# Patient Record
Sex: Female | Born: 1980 | Race: White | Hispanic: No | Marital: Married | Smoking: Never smoker
Health system: Southern US, Community
[De-identification: ages and names within clinical notes are randomized; demographics above are authoritative.]

## PROBLEM LIST (undated history)

## (undated) DIAGNOSIS — M5410 Radiculopathy, site unspecified: Secondary | ICD-10-CM

## (undated) DIAGNOSIS — T7840XA Allergy, unspecified, initial encounter: Secondary | ICD-10-CM

## (undated) HISTORY — DX: Radiculopathy, site unspecified: M54.10

## (undated) HISTORY — DX: Allergy, unspecified, initial encounter: T78.40XA

---

## 2002-07-25 ENCOUNTER — Other Ambulatory Visit: Admission: RE | Admit: 2002-07-25 | Discharge: 2002-07-25 | Payer: Self-pay | Admitting: Obstetrics and Gynecology

## 2003-10-09 ENCOUNTER — Other Ambulatory Visit: Admission: RE | Admit: 2003-10-09 | Discharge: 2003-10-09 | Payer: Self-pay | Admitting: Obstetrics and Gynecology

## 2004-11-24 ENCOUNTER — Emergency Department (HOSPITAL_COMMUNITY): Admission: EM | Admit: 2004-11-24 | Discharge: 2004-11-24 | Payer: Self-pay | Admitting: Emergency Medicine

## 2004-11-25 ENCOUNTER — Ambulatory Visit: Payer: Self-pay | Admitting: Family Medicine

## 2005-01-23 ENCOUNTER — Other Ambulatory Visit: Admission: RE | Admit: 2005-01-23 | Discharge: 2005-01-23 | Payer: Self-pay | Admitting: Obstetrics and Gynecology

## 2005-03-02 ENCOUNTER — Ambulatory Visit: Payer: Self-pay | Admitting: Family Medicine

## 2005-03-05 ENCOUNTER — Encounter: Admission: RE | Admit: 2005-03-05 | Discharge: 2005-06-03 | Payer: Self-pay | Admitting: Family Medicine

## 2005-08-10 ENCOUNTER — Ambulatory Visit: Payer: Self-pay | Admitting: Family Medicine

## 2005-12-04 ENCOUNTER — Ambulatory Visit: Payer: Self-pay | Admitting: Family Medicine

## 2006-03-02 ENCOUNTER — Ambulatory Visit: Payer: Self-pay | Admitting: Family Medicine

## 2006-03-15 ENCOUNTER — Other Ambulatory Visit: Admission: RE | Admit: 2006-03-15 | Discharge: 2006-03-15 | Payer: Self-pay | Admitting: Obstetrics and Gynecology

## 2006-05-10 ENCOUNTER — Ambulatory Visit: Payer: Self-pay | Admitting: Family Medicine

## 2006-05-14 ENCOUNTER — Ambulatory Visit: Payer: Self-pay

## 2006-07-16 ENCOUNTER — Ambulatory Visit: Payer: Self-pay | Admitting: Family Medicine

## 2006-11-30 ENCOUNTER — Ambulatory Visit: Payer: Self-pay | Admitting: Family Medicine

## 2007-04-19 ENCOUNTER — Ambulatory Visit: Payer: Self-pay | Admitting: Family Medicine

## 2007-07-19 ENCOUNTER — Ambulatory Visit: Payer: Self-pay | Admitting: Family Medicine

## 2007-07-19 DIAGNOSIS — J45909 Unspecified asthma, uncomplicated: Secondary | ICD-10-CM | POA: Insufficient documentation

## 2007-07-19 DIAGNOSIS — Z9189 Other specified personal risk factors, not elsewhere classified: Secondary | ICD-10-CM

## 2007-07-19 DIAGNOSIS — R51 Headache: Secondary | ICD-10-CM | POA: Insufficient documentation

## 2007-07-19 DIAGNOSIS — R519 Headache, unspecified: Secondary | ICD-10-CM | POA: Insufficient documentation

## 2007-07-19 DIAGNOSIS — J019 Acute sinusitis, unspecified: Secondary | ICD-10-CM

## 2007-07-19 DIAGNOSIS — J309 Allergic rhinitis, unspecified: Secondary | ICD-10-CM | POA: Insufficient documentation

## 2007-11-14 ENCOUNTER — Ambulatory Visit: Payer: Self-pay | Admitting: Family Medicine

## 2007-11-14 DIAGNOSIS — J069 Acute upper respiratory infection, unspecified: Secondary | ICD-10-CM

## 2008-01-30 ENCOUNTER — Telehealth: Payer: Self-pay | Admitting: Family Medicine

## 2008-02-01 ENCOUNTER — Telehealth: Payer: Self-pay | Admitting: Family Medicine

## 2008-02-07 ENCOUNTER — Ambulatory Visit: Payer: Self-pay | Admitting: Family Medicine

## 2008-02-09 ENCOUNTER — Ambulatory Visit: Payer: Self-pay | Admitting: Family Medicine

## 2008-06-26 ENCOUNTER — Inpatient Hospital Stay (HOSPITAL_COMMUNITY): Admission: RE | Admit: 2008-06-26 | Discharge: 2008-06-28 | Payer: Self-pay | Admitting: Obstetrics and Gynecology

## 2008-12-04 ENCOUNTER — Telehealth: Payer: Self-pay | Admitting: Family Medicine

## 2010-11-06 ENCOUNTER — Ambulatory Visit: Payer: Self-pay | Admitting: Family Medicine

## 2011-01-20 NOTE — Assessment & Plan Note (Signed)
Summary: sore throat//ccm   Vital Signs:  Patient profile:   30 year old female Weight:      159 pounds O2 Sat:      97 % on Room air Temp:     99.3 degrees F oral Pulse rate:   87 / minute Pulse rhythm:   regular Resp:     12 per minute BP sitting:   118 / 70  (left arm) Cuff size:   regular  Vitals Entered By: Gladis Riffle, RN (November 06, 2010 10:26 AM)  O2 Flow:  Room air  History of Present Illness:       This is a 30 year old woman who presents with URI symptoms for the last 4-5d.  The patient complains of nasal congestion, sore throat, and dry cough.  Associated symptoms include wheezing and chest tightness for the last 24h.  The patient denies fever.   She has hx of astma, has been doing fine lately on no controller meds.  Use of ventolin helped minimally last night--she noted it is expired.  Preventive Screening-Counseling & Management  Alcohol-Tobacco     Smoking Status: never  Current Problems (verified): 1)  Viral Uri  (ICD-465.9) 2)  Sinusitis, Acute Nos  (ICD-461.9) 3)  Chickenpox, Hx of  (ICD-V15.9) 4)  Headache  (ICD-784.0) 5)  Asthma  (ICD-493.90) 6)  Allergic Rhinitis  (ICD-477.9)  Current Medications (verified): 1)  Zyrtec Allergy 10 Mg  Tabs (Cetirizine Hcl) .... As Needed 2)  Ventolin Hfa 108 (90 Base) Mcg/act Aers (Albuterol Sulfate) .... 2 Puffs Q 4 Hours As Needed Asthma 3)  Prednisone 20 Mg Tabs (Prednisone) .... 2 Tabs By Mouth Once Daily X 5d  Allergies: 1)  ! Codeine 2)  ! Erythromycin  Past History:  Past medical history reviewed for relevance to current acute and chronic problems.  Past Medical History: Reviewed history from 07/19/2007 and no changes required. Allergic rhinitis Asthma Headache  Social History: Smoking Status:  never  Review of Systems       Flu Vaccine Consent Questions     Do you have a history of severe allergic reactions to this vaccine? no    Any prior history of allergic reactions to egg and/or gelatin?  no    Do you have a sensitivity to the preservative Thimersol? no    Do you have a past history of Guillan-Barre Syndrome? no    Do you currently have an acute febrile illness? no    Have you ever had a severe reaction to latex? no    Vaccine information given and explained to patient? yes    Are you currently pregnant? no    Lot Number:[id number]   Exp Date:06/20/2011   Site Given  Left Deltoid IM  Gladis Riffle, RN  November 06, 2010 11:21 AM   Physical Exam  General:  Gen: alert, NAD, NONTOXIC. HEENT: eyes without injection, drainage, or swelling.  Ears: EACs clear, TMs with normal light reflex and landmarks.  Nose: some dried, crusty exudate adherent to some mildly injected mucosa.  No purulent d/c.  No paranasal sinus TTP.  No facial swelling.  Throat and mouth without focal lesion.  No pharyngial swelling, erythema, or exudate.   Neck: supple, no LAD.  LUNGS: CTA bilat, nonlabored resps.  Aeration on expiration is poor, mildly prolonged.  CV: RRR, no m/r/g.     Impression & Recommendations:  Problem # 1:  VIRAL URI (ICD-465.9) Continue symptomatic care with mucinex dm and zyrtec.  Problem #  2:  ASTHMA (ICD-493.90) Mild acute exacerbation.  She felt improved after 2.5mg  albuterol neb here in office today. Flu vaccine IM given today.  Her updated medication list for this problem includes:    Ventolin Hfa 108 (90 Base) Mcg/act Aers (Albuterol sulfate) .Marland Kitchen... 2 puffs q 4 hours as needed asthma    Prednisone 20 Mg Tabs (Prednisone) .Marland Kitchen... 2 tabs by mouth once daily x 5d  Orders: Nebulizer Tx (16109)  Complete Medication List: 1)  Zyrtec Allergy 10 Mg Tabs (Cetirizine hcl) .... As needed 2)  Ventolin Hfa 108 (90 Base) Mcg/act Aers (Albuterol sulfate) .... 2 puffs q 4 hours as needed asthma 3)  Prednisone 20 Mg Tabs (Prednisone) .... 2 tabs by mouth once daily x 5d  Other Orders: Admin 1st Vaccine (60454) Flu Vaccine 31yrs + (09811)  Patient Instructions: 1)  Please schedule a  follow-up appointment as needed .  Prescriptions: PREDNISONE 20 MG TABS (PREDNISONE) 2 tabs by mouth once daily x 5d  #10 x 0   Entered and Authorized by:   Michell Heinrich M.D.   Signed by:   Michell Heinrich M.D. on 11/06/2010   Method used:   Electronically to        [facility]  [address] [phone]* (retail)       [address] [place], [place]  [address]       Ph: [phone] or [phone]       Fax: [phone]   RxID:   [id number] VENTOLIN HFA 108 (90 BASE) MCG/ACT AERS (ALBUTEROL SULFATE) 2 puffs q 4 hours as needed asthma  #1 x 1   Entered and Authorized by:   Michell Heinrich M.D.   Signed by:   Michell Heinrich M.D. on 11/06/2010   Method used:   [facility]ronically to        [facility]  [address] [phone][facility] (retail)       [address] [place], [place]  [address]       Ph: [phone] or [phone]       Fax: [phone]   RxID:   [id number]    Orders Added: 1)  Est. Patient Level IV [76160] 2)  Nebulizer Tx [94640] 3)  Admin 1st Vaccine [90471] 4)  Flu Vaccine 31yrs + [73710]  Appended Document: sore throat//ccm   Medication Administration  Medication # 1:    Medication: Albuterol Sulfate Sol 1mg  unit dose    Diagnosis: VIRAL URI (ICD-465.9)    Dose: 3ml    Route: inhaled    Exp Date: 09/21/2011    Lot #: [id number]    Mfr: nephron    Patient tolerated medication without complications    Given by: Gladis Riffle, RN (November 06, 2010 4:36 PM)  Orders Added: 1)  Albuterol Sulfate Sol 1mg  unit dose [N4627]

## 2011-02-23 ENCOUNTER — Encounter: Payer: Self-pay | Admitting: Internal Medicine

## 2011-02-23 ENCOUNTER — Ambulatory Visit (INDEPENDENT_AMBULATORY_CARE_PROVIDER_SITE_OTHER): Payer: 59 | Admitting: Internal Medicine

## 2011-02-23 DIAGNOSIS — J329 Chronic sinusitis, unspecified: Secondary | ICD-10-CM

## 2011-02-23 MED ORDER — AMOXICILLIN 875 MG PO TABS: 875.0000 mg | ORAL_TABLET | Freq: Two times a day (BID) | ORAL | Status: AC

## 2011-03-02 ENCOUNTER — Encounter: Payer: Self-pay | Admitting: Internal Medicine

## 2011-03-02 NOTE — Progress Notes (Signed)
  Subjective:    Patient ID: Erika [name], female    DOB: 1981/07/31, 30 y.o.   MRN: [id number]  HPI Pt presents to clinic for evaluation of nasal congestion. Notes 1.5 wk h/o intitial mild cough now resolved, nasal congestion and drainage as well as sinus pressure. +sick exposure with child. Now with 4d h/o bilateral ear pain and pressure. Attempting otc antihistamine, tylenol and mucinex without improvement. No alleviating or exacerbating factors.  Reviewed pmh, medications and allergies    Review of Systems  Constitutional: Negative for fever, chills and fatigue.  HENT: Positive for ear pain, congestion and rhinorrhea. Negative for facial swelling, neck pain and ear discharge.   Respiratory: Positive for cough. Negative for shortness of breath and wheezing.        Objective:   Physical Exam  Nursing note and vitals reviewed. Constitutional: She appears well-developed and well-nourished. No distress.  HENT:  Head: Normocephalic and atraumatic.  Right Ear: Tympanic membrane, external ear and ear canal normal.  Left Ear: Tympanic membrane, external ear and ear canal normal.  Nose: Nose normal.  Mouth/Throat: Oropharynx is clear and moist. No oropharyngeal exudate.  Eyes: Conjunctivae are normal.  Neck: Neck supple.  Cardiovascular: Normal rate, regular rhythm and normal heart sounds.  Exam reveals no gallop and no friction rub.   No murmur heard. Pulmonary/Chest: Effort normal and breath sounds normal. No respiratory distress. She has no wheezes. She has no rales.  Lymphadenopathy:    She has no cervical adenopathy.  Skin: Skin is warm and dry. She is not diaphoretic.          Assessment & Plan:

## 2011-03-02 NOTE — Assessment & Plan Note (Signed)
Begin abx tx. Followup if no improvement or worsening.  

## 2011-05-08 NOTE — Discharge Summary (Signed)
[name], [name] NO.:  [id number]   MEDICAL RECORD NO.:  [id number]          PATIENT TYPE:  INP   LOCATION:  9119                          FACILITY:  [facility]   PHYSICIAN:  Huel Cote, M.D. DATE OF BIRTH:  09-13-1981   DATE OF ADMISSION:  06/26/2008  DATE OF DISCHARGE:  06/28/2008                               DISCHARGE SUMMARY   DISCHARGE DIAGNOSES:  1. Term pregnancy at 14-5/7th weeks, delivered.  2. Status post vacuum-assisted vaginal delivery.   DISCHARGE MEDICATIONS:  1. Motrin 600 mg p.o. every 6 hours.  2. Percocet 1-2 tablets p.o. every 4 hours p.r.n.   DISCHARGE FOLLOWUP:  The patient is to follow up in the office in 6  weeks for a full postpartum exam.   HOSPITAL COURSE:  The patient is 30 year old G1, P0 who was admitted at  14-5/7the weeks gestation for induction of labor, given approaching her  41 weeks mark fetal heart rate.  There was a history of a heart defect  in her sibling and therefore the patient had a fetal echo done, which  was normal except for an isolated echogenic in a cardiac focus.  Her  quad screen on genetic screen was negative.   PRENATAL LABS:  The remainder of her prenatal labs are as follows a  negative.  Antibody negative.  Rubella immune.  RPR nonreactive.  Hepatitis B surface antigen negative.  HIV negative.  GC negative.  Chlamydia negative.  Group B strep negative.  A 1-hour Glucola 164.  A 3-  hour within normal limits.  Quad screen negative.   PAST OBSTETRICAL HISTORY:  None.   PAST MEDICAL HISTORY:  Asthma.   FAMILY HISTORY:  A sibling with a heart defect.   PAST SURGICAL HISTORY:  None.   PAST GYN HISTORY:  No abnormal Paps.   MEDICATIONS:  Albuterol p.r.n. MS and prenatal vitamins.   ALLERGIES:  Included ERYTHROMYCIN and LATEX sensitivity.   On admission, she was afebrile with stable vital signs.  Fetal heart  rate is reactive.  Abdomen was gravid and nontender.  Estimated fetal  weight was 8 to  8-1/2 pounds.  Cervix was 51 and -2 station.  She had  rupture of membranes performed and was on Pitocin.  She progressed  fairly well throughout the day and reached complete dilation.  She then  pushed for approximately 2-1/2 hours and advance the vertex to  +2 to +3  station.  Fetal heart rate was noted to have some tachycardia.  There  was a maternal temperature to 101.5.  Therefore, the mom was placed on  IV Unasyn.  The maternal effort diminished with the last 20 minutes of  pushing secondary to fatigue.  The patient was counseled, given minimal  progress and was agreeable to a vacuum-assisted vaginal delivery.  We  placed the soft cup vacuum to +2 and +3 station with a vertex delivered  to crowning into easy pulls.  The patient then pushed out the remainder  of the head, and after delivery of the vertex, there was a mild-to-  moderate shoulder dystocia encountered.  Her release included suprapubic  pressure, McRoberts in a posterior axillary release.  Apgars were 7 and  9, weight was 7 pounds 12 ounces.  There was a viable female infant.  Peds were called to evaluate the infant, given the shoulder distortional  and some terminal meconium, which was noted.  Placenta delivered  spontaneously.  Second-degree laceration was repaired with 2-0 Vicryl.  Sphincter was reinforced with 2-0 Vicryl.  Estimated blood loss was 450  mL.  Cervix and rectum were intact.  The patient was advised to the  shoulder dystocia, however, the baby was felt to be moving both arms  well.  There was some mild asymmetry to the face of the baby, which was  thought to be positional from delivery and this was to be observed.  Postpartum day #1, the patient was doing quite well.  She had no  complaints.  She was tolerating her Motrin and Percocet well.  On  postpartum day #2, she felt she was ready for discharge home.  Discharge  hemoglobin was 11.8 and she was to follow up in the office in 6 weeks  after  discharge.      Huel Cote, M.D.  Electronically Signed     KR/MEDQ  D:  07/28/2008  T:  07/29/2008  Job:  [id number]

## 2011-08-25 ENCOUNTER — Ambulatory Visit (INDEPENDENT_AMBULATORY_CARE_PROVIDER_SITE_OTHER): Payer: 59 | Admitting: Family Medicine

## 2011-08-25 ENCOUNTER — Encounter: Payer: Self-pay | Admitting: Family Medicine

## 2011-08-25 VITALS — BP 118/78 | HR 73 | Temp 98.5°F | Wt 162.0 lb

## 2011-08-25 DIAGNOSIS — M722 Plantar fascial fibromatosis: Secondary | ICD-10-CM

## 2011-08-25 NOTE — Progress Notes (Signed)
  Subjective:    Patient ID: Erika [name], female    DOB: [birth year], 30 y.o.   MRN: [id number]  HPI Here for one week of pain in the right heel. She has never felt this before. No recent trauma, but she has been running some to prepare for a half marathon coming up this fall. No swelling. Motrin helps a little.   Review of Systems  Constitutional: Negative.        Objective:   Physical Exam  Constitutional:       Walks with a slight limp  Musculoskeletal:       The inferior surface of the right heel is tender. No edema           Assessment & Plan:  We discussed wearing supportive shoes like athletic shoes at all times. She is to get some OTC gel arch inserts and place these inside her shoes. Try Diclofenac for inflammation.

## 2011-09-17 LAB — RPR: RPR Ser Ql: NONREACTIVE

## 2011-09-17 LAB — CBC
HCT: 34.7 — ABNORMAL LOW
HCT: 41.8
Hemoglobin: 11.8 — ABNORMAL LOW
Hemoglobin: 13.9
MCHC: 33.4
MCHC: 34.1
MCV: 91.1
MCV: 91.6
Platelets: 195
Platelets: 233
RBC: 3.79 — ABNORMAL LOW
RBC: 4.58
RDW: 13.6
RDW: 13.6
WBC: 10.7 — ABNORMAL HIGH
WBC: 17.5 — ABNORMAL HIGH

## 2011-09-17 LAB — RH IMMUNE GLOB WKUP(>/=20WKS)(NOT WOMEN'S HOSP): Fetal Screen: NEGATIVE

## 2011-11-27 ENCOUNTER — Ambulatory Visit (INDEPENDENT_AMBULATORY_CARE_PROVIDER_SITE_OTHER): Payer: BC Managed Care – PPO | Admitting: Family Medicine

## 2011-11-27 DIAGNOSIS — Z23 Encounter for immunization: Secondary | ICD-10-CM

## 2012-02-02 ENCOUNTER — Other Ambulatory Visit: Payer: Self-pay | Admitting: Allergy

## 2012-02-02 ENCOUNTER — Ambulatory Visit
Admission: RE | Admit: 2012-02-02 | Discharge: 2012-02-02 | Disposition: A | Discharge status: Home / Self Care | Payer: BC Managed Care – PPO | Source: Ambulatory Visit | Attending: Allergy | Admitting: Allergy

## 2012-02-02 DIAGNOSIS — R05 Cough: Secondary | ICD-10-CM

## 2012-02-02 DIAGNOSIS — R062 Wheezing: Secondary | ICD-10-CM

## 2012-02-02 DIAGNOSIS — R059 Cough, unspecified: Secondary | ICD-10-CM

## 2012-07-21 ENCOUNTER — Ambulatory Visit (INDEPENDENT_AMBULATORY_CARE_PROVIDER_SITE_OTHER): Payer: BC Managed Care – PPO | Admitting: Internal Medicine

## 2012-07-21 ENCOUNTER — Encounter: Payer: Self-pay | Admitting: Internal Medicine

## 2012-07-21 VITALS — BP 107/70 | Temp 98.6°F | Wt 170.0 lb

## 2012-07-21 DIAGNOSIS — J309 Allergic rhinitis, unspecified: Secondary | ICD-10-CM

## 2012-07-21 DIAGNOSIS — L259 Unspecified contact dermatitis, unspecified cause: Secondary | ICD-10-CM

## 2012-07-21 MED ORDER — TRIAMCINOLONE ACETONIDE 0.1 % EX CREA: TOPICAL_CREAM | Freq: Two times a day (BID) | CUTANEOUS | Status: AC

## 2012-07-21 NOTE — Patient Instructions (Addendum)
Poison [employer] ivy is a rash caused by touching the leaves of the poison ivy plant. The rash often shows up 48 hours later. You might just have bumps, redness, and itching. Sometimes, blisters appear and break open. Your eyes may get puffy (swollen). Poison ivy often heals in 2 to 3 weeks without treatment. HOME CARE  If you touch poison ivy:   Wash your skin with soap and water right away. Wash under your fingernails. Do not rub the skin very hard.   Wash any clothes you were wearing.   Avoid poison ivy in the future. Poison ivy has 3 leaves on a stem.   Use medicine to help with itching as told by your doctor. Do not drive when you take this medicine.   Keep open sores dry, clean, and covered with a bandage and medicated cream, if needed.   Ask your doctor about medicine for children.  GET HELP RIGHT AWAY IF:  You have open sores.   Redness spreads beyond the area of the rash.   There is yellowish white fluid (pus) coming from the rash.   Pain gets worse.   You have a temperature by mouth above 102 F (38.9 C), not controlled by medicine.  MAKE SURE YOU:  Understand these instructions.   Will watch your condition.   Will get help right away if you are not doing well or get worse.  Document Released: 01/09/2011 Document Revised: 11/26/2011 Document Reviewed: 01/09/2011 [facility] Patient Information 2012 [place], [place].  Call or return to clinic prn if these symptoms worsen or fail to improve as anticipated.

## 2012-07-21 NOTE — Progress Notes (Signed)
  Subjective:    Patient ID: Erika [name], female    DOB: [birth year], 31 y.o.   MRN: [id number]  HPI  31 year old patient who is seen today for followup. She presents with a one-week history of a rash involving her right forearm area. This began 2 days after pulling weeds in the lawn.  She has been treating this as a ringworm without benefit. Rash is quite pruritic     Review of Systems  Skin: Positive for rash.       Objective:   Physical Exam  Constitutional: She appears well-developed and well-nourished. No distress.  Skin: Rash noted.       Patchy erythematous slightly scaly eruption involving her right lower arm approximately 5 cm in diameter          Assessment & Plan:   Contact dermatitis. Will treat with topical triamcinolone cream. Will call if unimproved

## 2012-10-28 ENCOUNTER — Ambulatory Visit: Payer: BC Managed Care – PPO | Admitting: Family Medicine

## 2012-11-08 ENCOUNTER — Ambulatory Visit (INDEPENDENT_AMBULATORY_CARE_PROVIDER_SITE_OTHER): Payer: BC Managed Care – PPO | Admitting: Family Medicine

## 2012-11-08 DIAGNOSIS — Z23 Encounter for immunization: Secondary | ICD-10-CM

## 2013-05-18 ENCOUNTER — Encounter: Payer: Self-pay | Admitting: Nurse Practitioner

## 2013-05-18 ENCOUNTER — Ambulatory Visit (INDEPENDENT_AMBULATORY_CARE_PROVIDER_SITE_OTHER): Payer: BC Managed Care – PPO | Admitting: Nurse Practitioner

## 2013-05-18 VITALS — BP 110/70 | HR 68 | Temp 97.9°F | Resp 12

## 2013-05-18 DIAGNOSIS — J45901 Unspecified asthma with (acute) exacerbation: Secondary | ICD-10-CM

## 2013-05-18 DIAGNOSIS — J209 Acute bronchitis, unspecified: Secondary | ICD-10-CM

## 2013-05-18 MED ORDER — AZITHROMYCIN 250 MG PO TABS: ORAL_TABLET | ORAL | Status: DC

## 2013-05-18 MED ORDER — PREDNISONE 10 MG PO TABS: 10.0000 mg | ORAL_TABLET | Freq: Every day | ORAL | Status: DC

## 2013-05-18 NOTE — Patient Instructions (Signed)
I think you are having an asthma flare due to a viral infection. Some of your chest pain may be related to costochondritis. Prednisone will help both conditions. If you develop fever, seek re-evaluation.  Consider seeing your allergist again for daily asthma meds. Good control may prevent flares when you are sick.Feel better!

## 2013-05-18 NOTE — Progress Notes (Signed)
  Subjective:    Patient ID: Erika [name], female    DOB: 1981/07/08, 32 y.o.   MRN: [id number]  Cough This is a new problem. The current episode started in the past 7 days. The problem has been gradually worsening (chest hurts today). The problem occurs hourly. The cough is productive of sputum. Associated symptoms include headaches (nothing out of the ordinary), shortness of breath and wheezing. Pertinent negatives include no chills, ear pain, fever (no more than usual), nasal congestion, rash or sore throat. Chest pain: states feels like costochondritis, & chest tightness. Nothing aggravates the symptoms. She has tried a beta-agonist inhaler (2-3 times daily) for the symptoms. The treatment provided mild relief. Her past medical history is significant for asthma, bronchitis and pneumonia.      Review of Systems  Constitutional: Negative for fever (no more than usual) and chills.  HENT: Negative for ear pain and sore throat.   Respiratory: Positive for cough, chest tightness, shortness of breath and wheezing.   Cardiovascular: Chest pain: states feels like costochondritis, & chest tightness.  Gastrointestinal: Negative for abdominal pain.  Skin: Negative for rash.  Neurological: Positive for headaches (nothing out of the ordinary).       Objective:   Physical Exam  Vitals reviewed. Constitutional: She is oriented to person, place, and time. She appears well-developed and well-nourished. No distress.  HENT:  Head: Normocephalic and atraumatic.  Right Ear: Hearing, external ear and ear canal normal. A middle ear effusion is present.  Left Ear: Hearing, tympanic membrane, external ear and ear canal normal.  Mouth/Throat: Oropharynx is clear and moist. No oropharyngeal exudate.  Eyes: Conjunctivae are normal. Right eye exhibits no discharge. Left eye exhibits no discharge.  Neck: Normal range of motion. Neck supple. No thyromegaly present.  Cardiovascular: Normal rate, regular rhythm  and normal heart sounds.   No murmur heard. Pulmonary/Chest: Effort normal and breath sounds normal. No respiratory distress. She has no wheezes. She has no rales.  Musculoskeletal:  Gait normal  Lymphadenopathy:    She has no cervical adenopathy.  Neurological: She is alert and oriented to person, place, and time.  Skin: Skin is warm and dry. No rash noted.  Psychiatric: She has a normal mood and affect. Her behavior is normal. Thought content normal.          Assessment & Plan:  1. Acute bronchitis Continue to use rescue inhaler as needed - azithromycin (ZITHROMAX) 250 MG tablet; Take 2 tabs today, then 1 T daily for next 4 days.  Dispense: 6 tablet; Refill: 0  2. Unspecified asthma, with exacerbation Recommend seeing allergist again, consider resuming daily asthma meds as may have better control during times of illness.  - predniSONE (DELTASONE) 10 MG tablet; Take 1 tablet (10 mg total) by mouth daily. Take 4t po qd X 3d, then 3T po qd X 3d, then 2T po qd X 3d, then 1T po qd X 3d, then discontinue.  Dispense: 30 tablet; Refill: 0

## 2013-10-06 ENCOUNTER — Ambulatory Visit (INDEPENDENT_AMBULATORY_CARE_PROVIDER_SITE_OTHER): Payer: BC Managed Care – PPO

## 2013-10-06 DIAGNOSIS — Z23 Encounter for immunization: Secondary | ICD-10-CM

## 2015-08-07 ENCOUNTER — Ambulatory Visit
Admission: RE | Admit: 2015-08-07 | Discharge: 2015-08-07 | Disposition: A | Discharge status: Home / Self Care | Payer: BLUE CROSS/BLUE SHIELD | Source: Ambulatory Visit | Attending: Family Medicine | Admitting: Family Medicine

## 2015-08-07 ENCOUNTER — Ambulatory Visit (INDEPENDENT_AMBULATORY_CARE_PROVIDER_SITE_OTHER): Payer: BLUE CROSS/BLUE SHIELD | Admitting: Family Medicine

## 2015-08-07 ENCOUNTER — Encounter: Payer: Self-pay | Admitting: Family Medicine

## 2015-08-07 VITALS — BP 113/80 | HR 66 | Temp 98.2°F | Resp 16 | Wt 185.0 lb

## 2015-08-07 DIAGNOSIS — G629 Polyneuropathy, unspecified: Secondary | ICD-10-CM

## 2015-08-07 DIAGNOSIS — M545 Low back pain, unspecified: Secondary | ICD-10-CM

## 2015-08-07 DIAGNOSIS — G8929 Other chronic pain: Secondary | ICD-10-CM

## 2015-08-07 LAB — BASIC METABOLIC PANEL
BUN: 12 mg/dL (ref 6–23)
CO2: 30 mEq/L (ref 19–32)
Calcium: 9.4 mg/dL (ref 8.4–10.5)
Chloride: 105 mEq/L (ref 96–112)
Creatinine, Ser: 0.92 mg/dL (ref 0.40–1.20)
GFR: 73.74 mL/min (ref >60.00)
GLUCOSE: 89 mg/dL (ref 70–99)
Potassium: 4.9 mEq/L (ref 3.5–5.1)
Sodium: 141 mEq/L (ref 135–145)

## 2015-08-07 LAB — CBC WITH DIFFERENTIAL/PLATELET
BASOS PCT: 0.6 % (ref 0.0–3.0)
Basophils Absolute: 0 10*3/uL (ref 0.0–0.1)
EOS PCT: 0.8 % (ref 0.0–5.0)
Eosinophils Absolute: 0 10*3/uL (ref 0.0–0.7)
HCT: 44.8 % (ref 36.0–46.0)
Hemoglobin: 15 g/dL (ref 12.0–15.0)
Lymphocytes Relative: 34.3 % (ref 12.0–46.0)
Lymphs Abs: 1.4 10*3/uL (ref 0.7–4.0)
MCHC: 33.4 g/dL (ref 30.0–36.0)
MCV: 89.9 fl (ref 78.0–100.0)
Monocytes Absolute: 0.4 10*3/uL (ref 0.1–1.0)
Monocytes Relative: 10.1 % (ref 3.0–12.0)
NEUTROS PCT: 54.2 % (ref 43.0–77.0)
Neutro Abs: 2.3 10*3/uL (ref 1.4–7.7)
PLATELETS: 217 10*3/uL (ref 150.0–400.0)
RBC: 4.98 Mil/uL (ref 3.87–5.11)
RDW: 13 % (ref 11.5–15.5)
WBC: 4.2 10*3/uL (ref 4.0–10.5)

## 2015-08-07 LAB — HEMOGLOBIN A1C: Hgb A1c MFr Bld: 5.6 % (ref 4.6–6.5)

## 2015-08-07 LAB — VITAMIN B12: Vitamin B-12: 483 pg/mL (ref 211–911)

## 2015-08-07 NOTE — Progress Notes (Signed)
Pre visit review using our clinic review tool, if applicable. No additional management support is needed unless otherwise documented below in the visit note. 

## 2015-08-07 NOTE — Progress Notes (Signed)
OFFICE VISIT  08/07/2015   CC:  Chief Complaint  Patient presents with  . Tingling    right leg x 1 week   HPI:    Patient is a 34 y.o. Caucasian female who presents for  Has hx of chronic LBP since age 22 when trampoline accident--"pulled something".  Has not sought med attention for it ever. Lately more R LB pain and for the last week has noted R LL tingling and sensation that it has fallen asleep.  This is in a stocking glove distribution from foot up to knee on the right.  Going to bed/sleeping relieves the tingling but when she gets up and moves around the tingling returns.  Denies leg weakness.  Back pain lately 4-5/10 intensity--"I've learned to deal with it over the years".  No meds taken.  Rare ibuprofen dosing for LBP.  Heating pad helps.  No change in b/b function.  No saddle anesthesia.    +Hx of plantar fasciitis on R side, some sx's slightly returning lately: heel pain.     Past Medical History  Diagnosis Date  . Allergy   . Asthma     History reviewed. No pertinent past surgical history.  MEDS: Ventolin HFA 1-2 puffs q6h prn  Allergies  Allergen Reactions  . Codeine   . Erythromycin     Severe stomach pain    ROS As per HPI.  Also, no vision complaints, no headaches, no SOB  PE: Blood pressure 113/80, pulse 66, temperature 98.2 F (36.8 C), temperature source Oral, resp. rate 16, weight 185 lb (83.915 kg), SpO2 100 %. Gen: Alert, well appearing.  Patient is oriented to person, place, time, and situation. ZOX:WRUE: no injection, icteris, swelling, or exudate.  EOMI, PERRLA. Mouth: lips without lesion/swelling.  Oral mucosa pink and moist. Oropharynx without erythema, exudate, or swelling.  Neck: supple/nontender.  No LAD, mass, or TM.  Carotid pulses 2+ bilaterally, without bruits. CV: RRR, no m/r/g.   LUNGS: CTA bilat, nonlabored resps, good aeration in all lung fields. EXT: warm, pink, no edema.  Sitting SLR : on the right this elicits more tingling in  R LL, but no radicular pain. DP/PT pulses 2+ bilat. Mild TTP over medial calcaneal tubercle and over heel diffusely.  No achilles tenderness.   Neuro: CN 2-12 intact bilaterally, strength 5/5 in proximal and distal upper extremities and lower extremities bilaterally.  No sensory deficits.  No tremor.   No ataxia.  Upper extremity and lower extremity DTRs symmetric.   Back: no tenderness to palpation.  LABS:  none  IMPRESSION AND PLAN:  Right lower leg paresthesias in stocking glove distribution; normal neuro and vascular exam. Question peripheral neuropathy.  Will check CBC, BMET, HbA1c, and vit B12 (not fasting). Additionally, will check L spine plain films. I will ask neuro for help: neuro referral ordered today.  An After Visit Summary was printed and given to the patient.  FOLLOW UP: No Follow-up on file.

## 2015-09-05 ENCOUNTER — Other Ambulatory Visit (INDEPENDENT_AMBULATORY_CARE_PROVIDER_SITE_OTHER): Payer: BLUE CROSS/BLUE SHIELD

## 2015-09-05 DIAGNOSIS — Z0000 Encounter for general adult medical examination without abnormal findings: Secondary | ICD-10-CM

## 2015-09-05 LAB — CBC WITH DIFFERENTIAL/PLATELET
BASOS PCT: 0.7 % (ref 0.0–3.0)
Basophils Absolute: 0 10*3/uL (ref 0.0–0.1)
EOS ABS: 0 10*3/uL (ref 0.0–0.7)
Eosinophils Relative: 1 % (ref 0.0–5.0)
HCT: 43.4 % (ref 36.0–46.0)
Hemoglobin: 14.7 g/dL (ref 12.0–15.0)
Lymphocytes Relative: 32.3 % (ref 12.0–46.0)
Lymphs Abs: 1.4 10*3/uL (ref 0.7–4.0)
MCHC: 33.8 g/dL (ref 30.0–36.0)
MCV: 89.4 fl (ref 78.0–100.0)
MONO ABS: 0.5 10*3/uL (ref 0.1–1.0)
Monocytes Relative: 11.1 % (ref 3.0–12.0)
Neutro Abs: 2.4 10*3/uL (ref 1.4–7.7)
Neutrophils Relative %: 54.9 % (ref 43.0–77.0)
Platelets: 203 10*3/uL (ref 150.0–400.0)
RBC: 4.86 Mil/uL (ref 3.87–5.11)
RDW: 12.3 % (ref 11.5–15.5)
WBC: 4.5 10*3/uL (ref 4.0–10.5)

## 2015-09-05 LAB — BASIC METABOLIC PANEL
BUN: 13 mg/dL (ref 6–23)
CHLORIDE: 104 meq/L (ref 96–112)
CO2: 30 meq/L (ref 19–32)
CREATININE: 0.92 mg/dL (ref 0.40–1.20)
Calcium: 9.5 mg/dL (ref 8.4–10.5)
GFR: 73.71 mL/min (ref >60.00)
Glucose, Bld: 90 mg/dL (ref 70–99)
Potassium: 4.9 mEq/L (ref 3.5–5.1)
Sodium: 141 mEq/L (ref 135–145)

## 2015-09-05 LAB — TSH: TSH: 1.97 u[IU]/mL (ref 0.35–4.50)

## 2015-09-05 LAB — LIPID PANEL
Cholesterol: 155 mg/dL (ref 0–200)
HDL: 55.4 mg/dL (ref >39.00)
LDL Cholesterol: 88 mg/dL (ref 0–99)
NonHDL: 99.7
Total CHOL/HDL Ratio: 3
Triglycerides: 57 mg/dL (ref 0.0–149.0)
VLDL: 11.4 mg/dL (ref 0.0–40.0)

## 2015-09-05 LAB — POCT URINALYSIS DIPSTICK
Bilirubin, UA: NEGATIVE
Blood, UA: NEGATIVE
Glucose, UA: NEGATIVE
Ketones, UA: NEGATIVE
Leukocytes, UA: NEGATIVE
Nitrite, UA: NEGATIVE
SPEC GRAV UA: 1.02
Urobilinogen, UA: 0.2
pH, UA: 7

## 2015-09-05 LAB — HEPATIC FUNCTION PANEL
ALT: 18 U/L (ref 0–35)
AST: 20 U/L (ref 0–37)
Albumin: 4.4 g/dL (ref 3.5–5.2)
Alkaline Phosphatase: 47 U/L (ref 39–117)
Bilirubin, Direct: 0.1 mg/dL (ref 0.0–0.3)
Total Bilirubin: 0.4 mg/dL (ref 0.2–1.2)
Total Protein: 7.2 g/dL (ref 6.0–8.3)

## 2015-09-11 ENCOUNTER — Encounter: Payer: Self-pay | Admitting: Family Medicine

## 2015-09-11 ENCOUNTER — Ambulatory Visit (INDEPENDENT_AMBULATORY_CARE_PROVIDER_SITE_OTHER): Payer: BLUE CROSS/BLUE SHIELD | Admitting: Family Medicine

## 2015-09-11 VITALS — BP 119/79 | HR 57 | Temp 98.2°F | Ht 67.0 in | Wt 186.0 lb

## 2015-09-11 DIAGNOSIS — Z0000 Encounter for general adult medical examination without abnormal findings: Secondary | ICD-10-CM | POA: Diagnosis not present

## 2015-09-11 MED ORDER — DICLOFENAC SODIUM 75 MG PO TBEC: 75.0000 mg | DELAYED_RELEASE_TABLET | Freq: Two times a day (BID) | ORAL | Status: DC

## 2015-09-11 MED ORDER — ALBUTEROL SULFATE HFA 108 (90 BASE) MCG/ACT IN AERS: 2.0000 | INHALATION_SPRAY | RESPIRATORY_TRACT | Status: DC | PRN

## 2015-09-11 NOTE — Progress Notes (Signed)
   Subjective:    Patient ID: Erika [name], female    DOB: 11-Jul-1981, 34 y.o.   MRN: [id number]  HPI 34 yr old female for a cpx. She feels well except for some pain in the right foot tat started 2 months ago. She has had plantar fasciitis in the past and she thinks it has returned. She has pain in the heel and the sole of the foot. No swelling. Advil helps.   Review of Systems  Constitutional: Negative.   HENT: Negative.   Eyes: Negative.   Respiratory: Negative.   Cardiovascular: Negative.   Gastrointestinal: Negative.   Genitourinary: Negative for dysuria, urgency, frequency, hematuria, flank pain, decreased urine volume, enuresis, difficulty urinating, pelvic pain and dyspareunia.  Musculoskeletal: Negative.   Skin: Negative.   Neurological: Negative.   Psychiatric/Behavioral: Negative.        Objective:   Physical Exam  Constitutional: She is oriented to person, place, and time. She appears well-developed and well-nourished. No distress.  HENT:  Head: Normocephalic and atraumatic.  Right Ear: External ear normal.  Left Ear: External ear normal.  Nose: Nose normal.  Mouth/Throat: Oropharynx is clear and moist. No oropharyngeal exudate.  Eyes: Conjunctivae and EOM are normal. Pupils are equal, round, and reactive to light. No scleral icterus.  Neck: Normal range of motion. Neck supple. No JVD present. No thyromegaly present.  Cardiovascular: Normal rate, regular rhythm, normal heart sounds and intact distal pulses.  Exam reveals no gallop and no friction rub.   No murmur heard. Pulmonary/Chest: Effort normal and breath sounds normal. No respiratory distress. She has no wheezes. She has no rales. She exhibits no tenderness.  Abdominal: Soft. Bowel sounds are normal. She exhibits no distension and no mass. There is no tenderness. There is no rebound and no guarding.  Musculoskeletal: Normal range of motion. She exhibits no edema or tenderness.  Lymphadenopathy:    She has  no cervical adenopathy.  Neurological: She is alert and oriented to person, place, and time. She has normal reflexes. No cranial nerve deficit. She exhibits normal muscle tone. Coordination normal.  Skin: Skin is warm and dry. No rash noted. No erythema.  Psychiatric: She has a normal mood and affect. Her behavior is normal. Judgment and thought content normal.          Assessment & Plan:  Well exam. We reviewed diet and exercise advice. She does have some plantar fasciitis. So we will treat this with Diclofenac. Wear arch supports

## 2015-09-11 NOTE — Progress Notes (Signed)
Pre visit review using our clinic review tool, if applicable. No additional management support is needed unless otherwise documented below in the visit note. 

## 2015-09-27 ENCOUNTER — Ambulatory Visit: Payer: BLUE CROSS/BLUE SHIELD | Admitting: Neurology

## 2016-10-27 DIAGNOSIS — J3089 Other allergic rhinitis: Secondary | ICD-10-CM | POA: Diagnosis not present

## 2016-10-27 DIAGNOSIS — J301 Allergic rhinitis due to pollen: Secondary | ICD-10-CM | POA: Diagnosis not present

## 2016-10-27 DIAGNOSIS — J3081 Allergic rhinitis due to animal (cat) (dog) hair and dander: Secondary | ICD-10-CM | POA: Diagnosis not present

## 2016-11-25 DIAGNOSIS — J0100 Acute maxillary sinusitis, unspecified: Secondary | ICD-10-CM | POA: Diagnosis not present

## 2016-12-01 DIAGNOSIS — J3081 Allergic rhinitis due to animal (cat) (dog) hair and dander: Secondary | ICD-10-CM | POA: Diagnosis not present

## 2016-12-01 DIAGNOSIS — J301 Allergic rhinitis due to pollen: Secondary | ICD-10-CM | POA: Diagnosis not present

## 2016-12-01 DIAGNOSIS — J3089 Other allergic rhinitis: Secondary | ICD-10-CM | POA: Diagnosis not present

## 2016-12-08 DIAGNOSIS — J301 Allergic rhinitis due to pollen: Secondary | ICD-10-CM | POA: Diagnosis not present

## 2016-12-08 DIAGNOSIS — J3081 Allergic rhinitis due to animal (cat) (dog) hair and dander: Secondary | ICD-10-CM | POA: Diagnosis not present

## 2016-12-08 DIAGNOSIS — J3089 Other allergic rhinitis: Secondary | ICD-10-CM | POA: Diagnosis not present

## 2017-01-12 DIAGNOSIS — J301 Allergic rhinitis due to pollen: Secondary | ICD-10-CM | POA: Diagnosis not present

## 2017-01-12 DIAGNOSIS — J3089 Other allergic rhinitis: Secondary | ICD-10-CM | POA: Diagnosis not present

## 2017-01-12 DIAGNOSIS — J3081 Allergic rhinitis due to animal (cat) (dog) hair and dander: Secondary | ICD-10-CM | POA: Diagnosis not present

## 2017-01-26 DIAGNOSIS — J3081 Allergic rhinitis due to animal (cat) (dog) hair and dander: Secondary | ICD-10-CM | POA: Diagnosis not present

## 2017-01-26 DIAGNOSIS — J3089 Other allergic rhinitis: Secondary | ICD-10-CM | POA: Diagnosis not present

## 2017-01-26 DIAGNOSIS — J301 Allergic rhinitis due to pollen: Secondary | ICD-10-CM | POA: Diagnosis not present

## 2017-02-02 DIAGNOSIS — J3081 Allergic rhinitis due to animal (cat) (dog) hair and dander: Secondary | ICD-10-CM | POA: Diagnosis not present

## 2017-02-02 DIAGNOSIS — J3089 Other allergic rhinitis: Secondary | ICD-10-CM | POA: Diagnosis not present

## 2017-02-02 DIAGNOSIS — J301 Allergic rhinitis due to pollen: Secondary | ICD-10-CM | POA: Diagnosis not present

## 2017-02-26 DIAGNOSIS — J3081 Allergic rhinitis due to animal (cat) (dog) hair and dander: Secondary | ICD-10-CM | POA: Diagnosis not present

## 2017-02-26 DIAGNOSIS — J3089 Other allergic rhinitis: Secondary | ICD-10-CM | POA: Diagnosis not present

## 2017-02-26 DIAGNOSIS — J301 Allergic rhinitis due to pollen: Secondary | ICD-10-CM | POA: Diagnosis not present

## 2017-02-26 DIAGNOSIS — J4520 Mild intermittent asthma, uncomplicated: Secondary | ICD-10-CM | POA: Diagnosis not present

## 2017-03-11 DIAGNOSIS — J301 Allergic rhinitis due to pollen: Secondary | ICD-10-CM | POA: Diagnosis not present

## 2017-03-11 DIAGNOSIS — J3089 Other allergic rhinitis: Secondary | ICD-10-CM | POA: Diagnosis not present

## 2017-03-11 DIAGNOSIS — J3081 Allergic rhinitis due to animal (cat) (dog) hair and dander: Secondary | ICD-10-CM | POA: Diagnosis not present

## 2017-03-16 DIAGNOSIS — J3081 Allergic rhinitis due to animal (cat) (dog) hair and dander: Secondary | ICD-10-CM | POA: Diagnosis not present

## 2017-03-16 DIAGNOSIS — J301 Allergic rhinitis due to pollen: Secondary | ICD-10-CM | POA: Diagnosis not present

## 2017-03-16 DIAGNOSIS — J3089 Other allergic rhinitis: Secondary | ICD-10-CM | POA: Diagnosis not present

## 2017-03-16 IMAGING — CR DG LUMBAR SPINE COMPLETE 4+V
5 series · 5 of 5 positions shown · non-contrast
Comparison: None.

CLINICAL DATA: Chronic low back pain. Worsening low back pain with
radiation to left thigh for past 2 weeks.

EXAM:
LUMBAR SPINE - COMPLETE 4+ VIEW

[w lumbar spine ap]
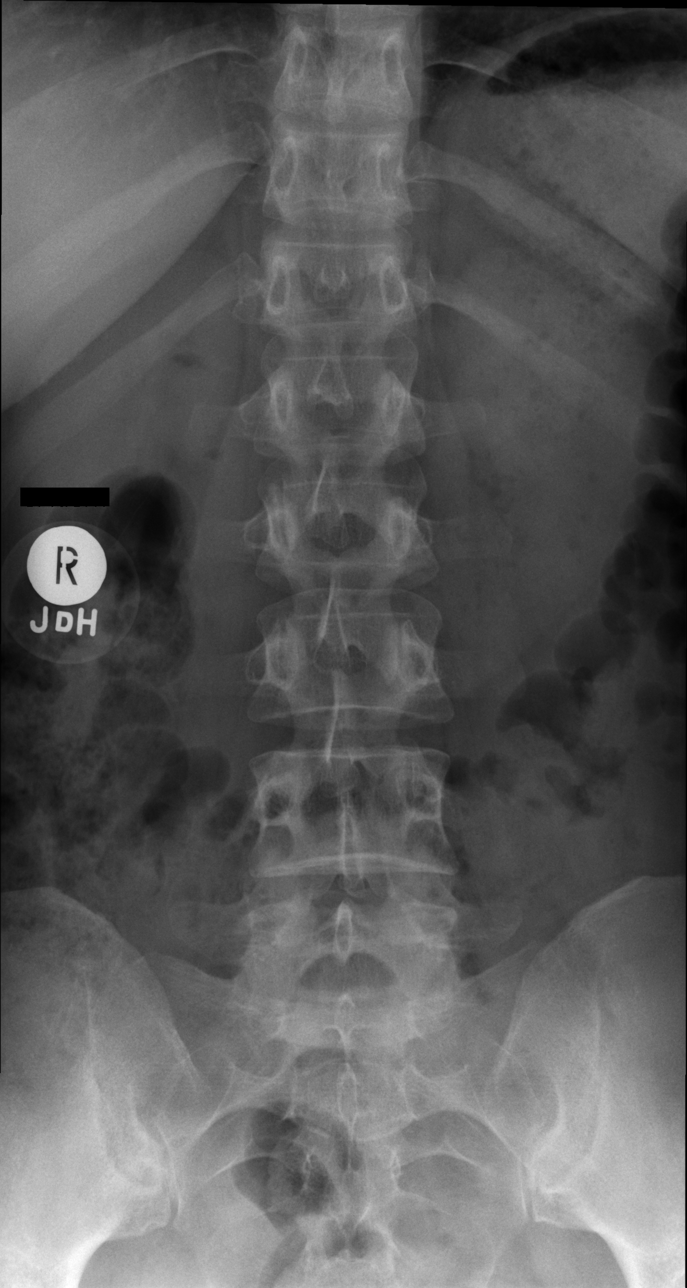

[w lumbar spine obl (1 of 2)]
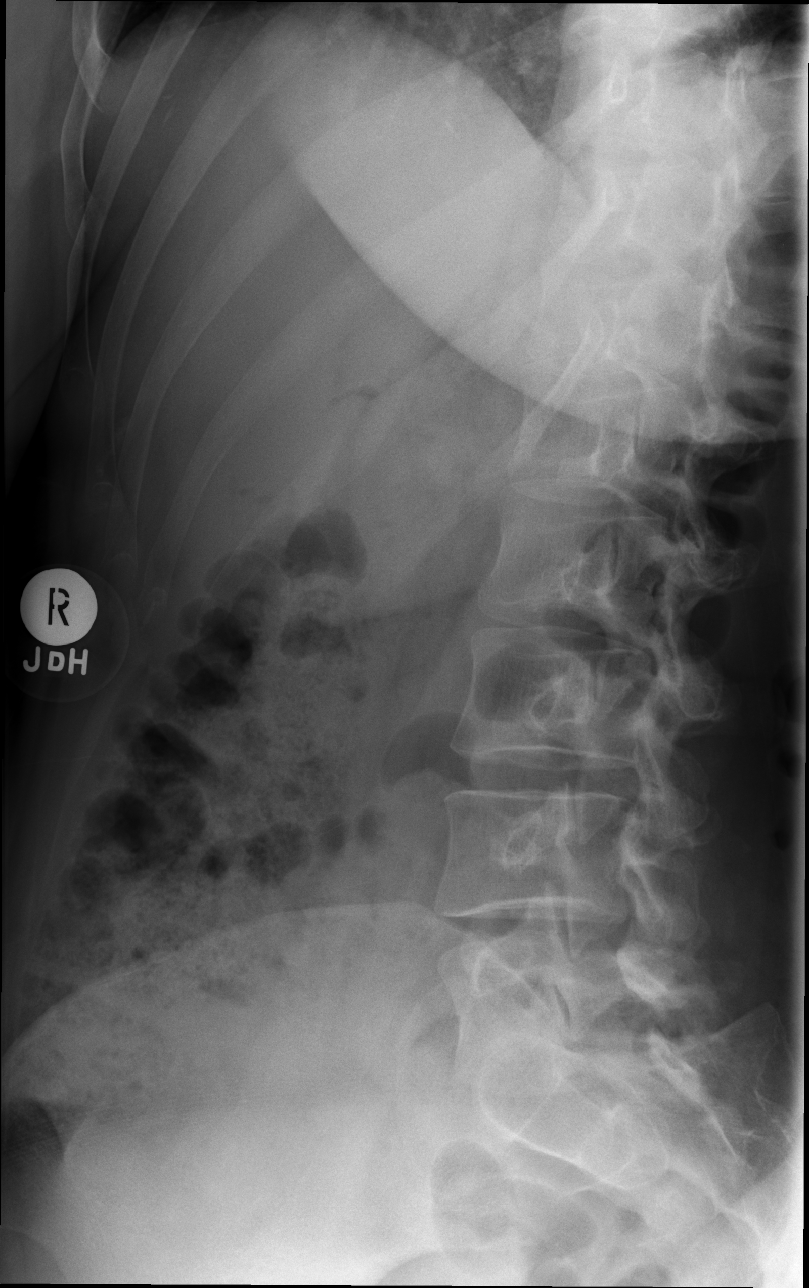

[w lumbar spine obl (2 of 2)]
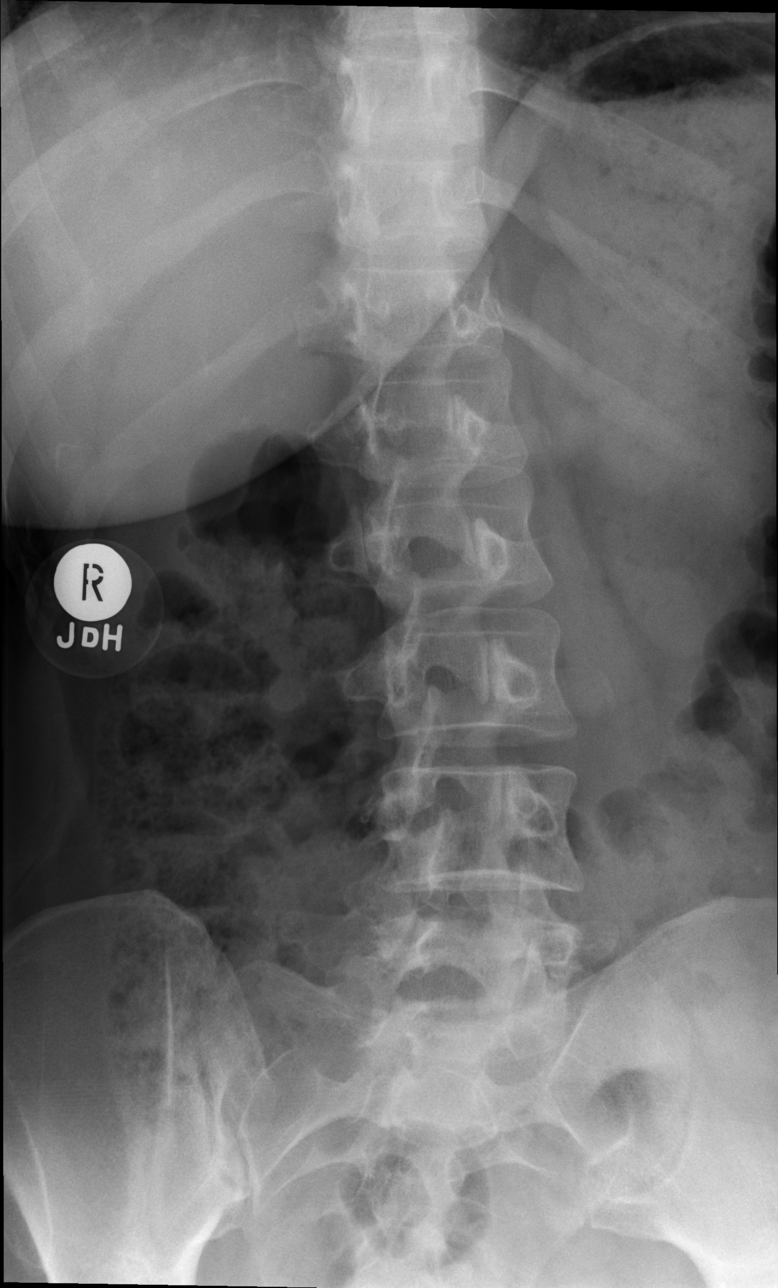

[w lumbar spine lat]
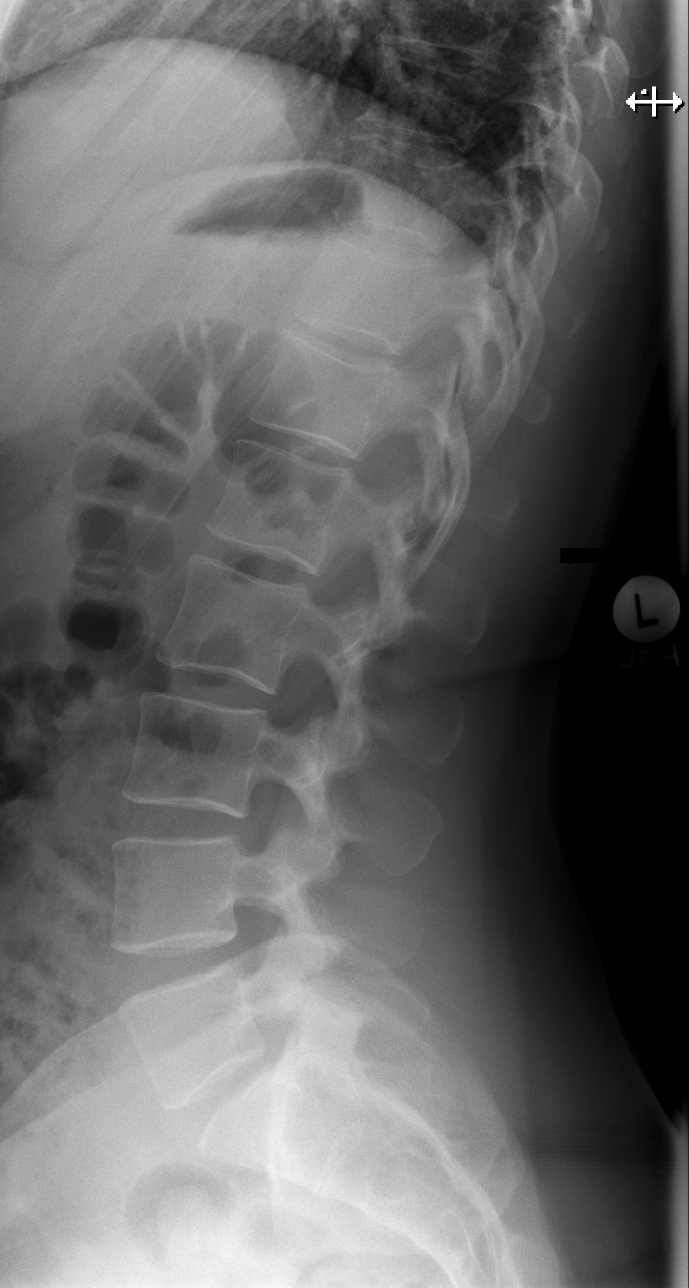

[w lumbar l-5 s-1 spot]
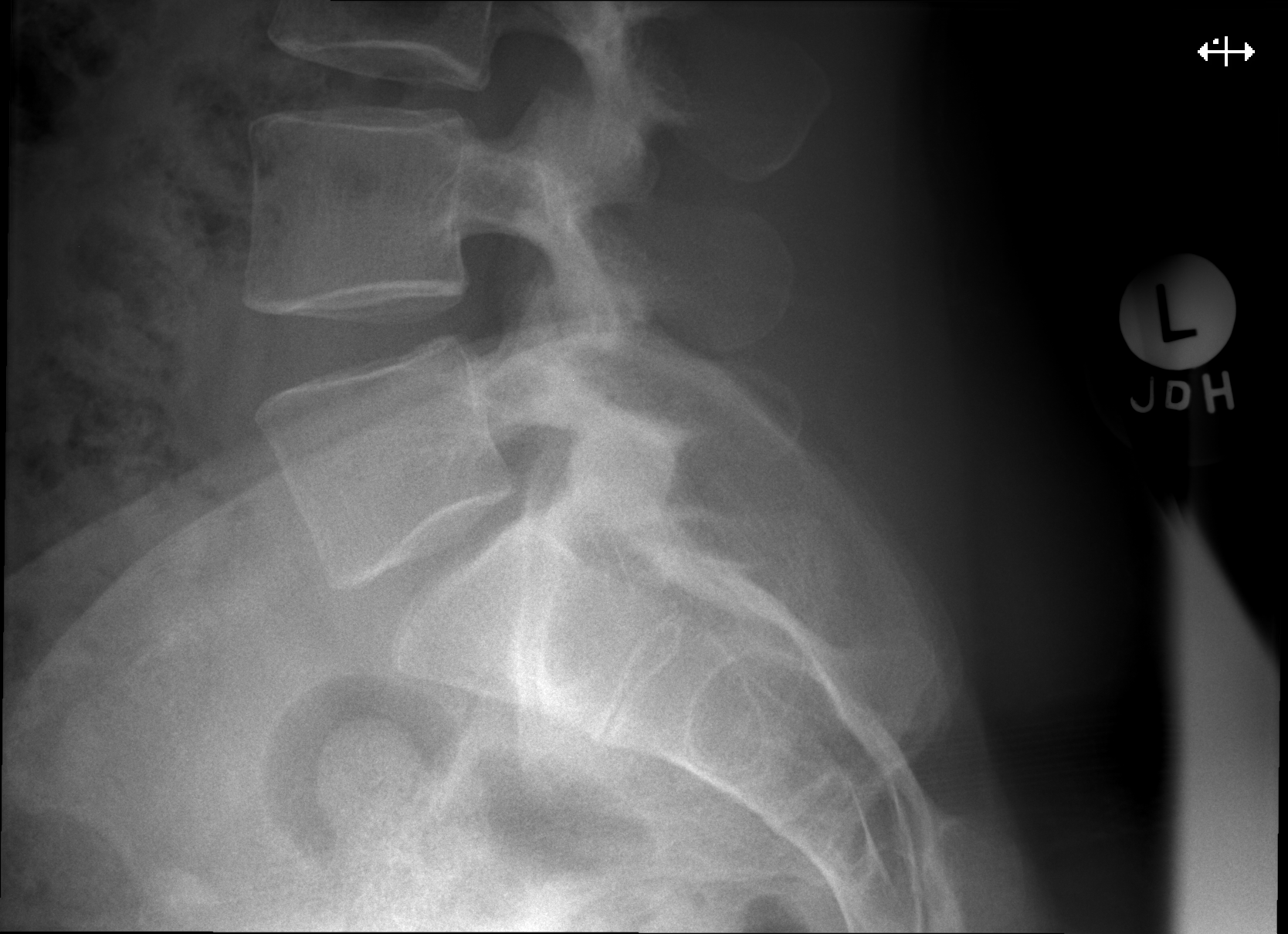

[5 of 5 positions shown; findings below may reference images not displayed]

FINDINGS: There is no evidence of lumbar spine fracture. Alignment is normal.
Intervertebral disc spaces are maintained. No evidence of facet
arthropathy or other significant bone abnormality.
IMPRESSION: Negative lumbar spine radiographs.

## 2017-03-24 DIAGNOSIS — J3081 Allergic rhinitis due to animal (cat) (dog) hair and dander: Secondary | ICD-10-CM | POA: Diagnosis not present

## 2017-03-24 DIAGNOSIS — J3089 Other allergic rhinitis: Secondary | ICD-10-CM | POA: Diagnosis not present

## 2017-03-24 DIAGNOSIS — J301 Allergic rhinitis due to pollen: Secondary | ICD-10-CM | POA: Diagnosis not present

## 2017-03-31 DIAGNOSIS — J301 Allergic rhinitis due to pollen: Secondary | ICD-10-CM | POA: Diagnosis not present

## 2017-04-28 DIAGNOSIS — J301 Allergic rhinitis due to pollen: Secondary | ICD-10-CM | POA: Diagnosis not present

## 2017-04-28 DIAGNOSIS — J3089 Other allergic rhinitis: Secondary | ICD-10-CM | POA: Diagnosis not present

## 2017-04-28 DIAGNOSIS — J3081 Allergic rhinitis due to animal (cat) (dog) hair and dander: Secondary | ICD-10-CM | POA: Diagnosis not present

## 2017-04-29 DIAGNOSIS — J3089 Other allergic rhinitis: Secondary | ICD-10-CM | POA: Diagnosis not present

## 2017-04-29 DIAGNOSIS — J3081 Allergic rhinitis due to animal (cat) (dog) hair and dander: Secondary | ICD-10-CM | POA: Diagnosis not present

## 2017-05-05 DIAGNOSIS — J301 Allergic rhinitis due to pollen: Secondary | ICD-10-CM | POA: Diagnosis not present

## 2017-05-05 DIAGNOSIS — J3081 Allergic rhinitis due to animal (cat) (dog) hair and dander: Secondary | ICD-10-CM | POA: Diagnosis not present

## 2017-05-05 DIAGNOSIS — J3089 Other allergic rhinitis: Secondary | ICD-10-CM | POA: Diagnosis not present

## 2017-05-13 DIAGNOSIS — J3081 Allergic rhinitis due to animal (cat) (dog) hair and dander: Secondary | ICD-10-CM | POA: Diagnosis not present

## 2017-05-13 DIAGNOSIS — J301 Allergic rhinitis due to pollen: Secondary | ICD-10-CM | POA: Diagnosis not present

## 2017-05-13 DIAGNOSIS — J3089 Other allergic rhinitis: Secondary | ICD-10-CM | POA: Diagnosis not present

## 2017-05-28 DIAGNOSIS — J301 Allergic rhinitis due to pollen: Secondary | ICD-10-CM | POA: Diagnosis not present

## 2017-05-28 DIAGNOSIS — J3089 Other allergic rhinitis: Secondary | ICD-10-CM | POA: Diagnosis not present

## 2017-05-28 DIAGNOSIS — J3081 Allergic rhinitis due to animal (cat) (dog) hair and dander: Secondary | ICD-10-CM | POA: Diagnosis not present

## 2017-06-07 DIAGNOSIS — J3089 Other allergic rhinitis: Secondary | ICD-10-CM | POA: Diagnosis not present

## 2017-06-07 DIAGNOSIS — J301 Allergic rhinitis due to pollen: Secondary | ICD-10-CM | POA: Diagnosis not present

## 2017-06-07 DIAGNOSIS — J3081 Allergic rhinitis due to animal (cat) (dog) hair and dander: Secondary | ICD-10-CM | POA: Diagnosis not present

## 2017-06-15 DIAGNOSIS — J3089 Other allergic rhinitis: Secondary | ICD-10-CM | POA: Diagnosis not present

## 2017-06-15 DIAGNOSIS — J3081 Allergic rhinitis due to animal (cat) (dog) hair and dander: Secondary | ICD-10-CM | POA: Diagnosis not present

## 2017-06-15 DIAGNOSIS — J301 Allergic rhinitis due to pollen: Secondary | ICD-10-CM | POA: Diagnosis not present

## 2017-06-22 DIAGNOSIS — J3081 Allergic rhinitis due to animal (cat) (dog) hair and dander: Secondary | ICD-10-CM | POA: Diagnosis not present

## 2017-06-22 DIAGNOSIS — J3089 Other allergic rhinitis: Secondary | ICD-10-CM | POA: Diagnosis not present

## 2017-06-22 DIAGNOSIS — J301 Allergic rhinitis due to pollen: Secondary | ICD-10-CM | POA: Diagnosis not present

## 2017-07-06 DIAGNOSIS — J301 Allergic rhinitis due to pollen: Secondary | ICD-10-CM | POA: Diagnosis not present

## 2017-07-06 DIAGNOSIS — J3089 Other allergic rhinitis: Secondary | ICD-10-CM | POA: Diagnosis not present

## 2017-07-06 DIAGNOSIS — J3081 Allergic rhinitis due to animal (cat) (dog) hair and dander: Secondary | ICD-10-CM | POA: Diagnosis not present

## 2017-08-03 DIAGNOSIS — J3081 Allergic rhinitis due to animal (cat) (dog) hair and dander: Secondary | ICD-10-CM | POA: Diagnosis not present

## 2017-08-03 DIAGNOSIS — J301 Allergic rhinitis due to pollen: Secondary | ICD-10-CM | POA: Diagnosis not present

## 2017-08-03 DIAGNOSIS — J3089 Other allergic rhinitis: Secondary | ICD-10-CM | POA: Diagnosis not present

## 2017-08-12 DIAGNOSIS — J301 Allergic rhinitis due to pollen: Secondary | ICD-10-CM | POA: Diagnosis not present

## 2017-08-12 DIAGNOSIS — J3081 Allergic rhinitis due to animal (cat) (dog) hair and dander: Secondary | ICD-10-CM | POA: Diagnosis not present

## 2017-08-12 DIAGNOSIS — J3089 Other allergic rhinitis: Secondary | ICD-10-CM | POA: Diagnosis not present

## 2017-09-22 DIAGNOSIS — N914 Secondary oligomenorrhea: Secondary | ICD-10-CM | POA: Diagnosis not present

## 2017-09-22 DIAGNOSIS — Z130 Encounter for screening for diseases of the blood and blood-forming organs and certain disorders involving the immune mechanism: Secondary | ICD-10-CM | POA: Diagnosis not present

## 2017-09-22 DIAGNOSIS — Z6821 Body mass index (BMI) 21.0-21.9, adult: Secondary | ICD-10-CM | POA: Diagnosis not present

## 2017-09-22 DIAGNOSIS — Z1389 Encounter for screening for other disorder: Secondary | ICD-10-CM | POA: Diagnosis not present

## 2017-09-22 DIAGNOSIS — Z01419 Encounter for gynecological examination (general) (routine) without abnormal findings: Secondary | ICD-10-CM | POA: Diagnosis not present

## 2017-10-11 DIAGNOSIS — J301 Allergic rhinitis due to pollen: Secondary | ICD-10-CM | POA: Diagnosis not present

## 2017-10-11 DIAGNOSIS — J3089 Other allergic rhinitis: Secondary | ICD-10-CM | POA: Diagnosis not present

## 2017-10-11 DIAGNOSIS — J3081 Allergic rhinitis due to animal (cat) (dog) hair and dander: Secondary | ICD-10-CM | POA: Diagnosis not present

## 2018-02-28 DIAGNOSIS — J4520 Mild intermittent asthma, uncomplicated: Secondary | ICD-10-CM | POA: Diagnosis not present

## 2018-02-28 DIAGNOSIS — J3089 Other allergic rhinitis: Secondary | ICD-10-CM | POA: Diagnosis not present

## 2018-02-28 DIAGNOSIS — J301 Allergic rhinitis due to pollen: Secondary | ICD-10-CM | POA: Diagnosis not present

## 2018-02-28 DIAGNOSIS — J3081 Allergic rhinitis due to animal (cat) (dog) hair and dander: Secondary | ICD-10-CM | POA: Diagnosis not present

## 2018-03-29 DIAGNOSIS — J209 Acute bronchitis, unspecified: Secondary | ICD-10-CM | POA: Diagnosis not present

## 2018-03-29 DIAGNOSIS — J309 Allergic rhinitis, unspecified: Secondary | ICD-10-CM | POA: Diagnosis not present

## 2018-03-29 DIAGNOSIS — J0140 Acute pansinusitis, unspecified: Secondary | ICD-10-CM | POA: Diagnosis not present

## 2018-05-23 DIAGNOSIS — M6281 Muscle weakness (generalized): Secondary | ICD-10-CM | POA: Diagnosis not present

## 2018-05-23 DIAGNOSIS — M545 Low back pain: Secondary | ICD-10-CM | POA: Diagnosis not present

## 2018-05-26 DIAGNOSIS — M545 Low back pain: Secondary | ICD-10-CM | POA: Diagnosis not present

## 2018-05-26 DIAGNOSIS — M6281 Muscle weakness (generalized): Secondary | ICD-10-CM | POA: Diagnosis not present

## 2018-05-30 DIAGNOSIS — M6281 Muscle weakness (generalized): Secondary | ICD-10-CM | POA: Diagnosis not present

## 2018-05-30 DIAGNOSIS — M545 Low back pain: Secondary | ICD-10-CM | POA: Diagnosis not present

## 2018-06-02 DIAGNOSIS — M6281 Muscle weakness (generalized): Secondary | ICD-10-CM | POA: Diagnosis not present

## 2018-06-02 DIAGNOSIS — M545 Low back pain: Secondary | ICD-10-CM | POA: Diagnosis not present

## 2018-06-07 DIAGNOSIS — M6281 Muscle weakness (generalized): Secondary | ICD-10-CM | POA: Diagnosis not present

## 2018-06-07 DIAGNOSIS — M545 Low back pain: Secondary | ICD-10-CM | POA: Diagnosis not present

## 2018-06-09 DIAGNOSIS — M6281 Muscle weakness (generalized): Secondary | ICD-10-CM | POA: Diagnosis not present

## 2018-06-09 DIAGNOSIS — M545 Low back pain: Secondary | ICD-10-CM | POA: Diagnosis not present

## 2018-06-13 DIAGNOSIS — M545 Low back pain: Secondary | ICD-10-CM | POA: Diagnosis not present

## 2018-06-13 DIAGNOSIS — M6281 Muscle weakness (generalized): Secondary | ICD-10-CM | POA: Diagnosis not present

## 2018-06-15 DIAGNOSIS — M545 Low back pain: Secondary | ICD-10-CM | POA: Diagnosis not present

## 2018-06-15 DIAGNOSIS — M6281 Muscle weakness (generalized): Secondary | ICD-10-CM | POA: Diagnosis not present

## 2018-06-20 DIAGNOSIS — M6281 Muscle weakness (generalized): Secondary | ICD-10-CM | POA: Diagnosis not present

## 2018-06-20 DIAGNOSIS — M545 Low back pain: Secondary | ICD-10-CM | POA: Diagnosis not present

## 2018-06-22 DIAGNOSIS — M545 Low back pain: Secondary | ICD-10-CM | POA: Diagnosis not present

## 2018-06-22 DIAGNOSIS — M6281 Muscle weakness (generalized): Secondary | ICD-10-CM | POA: Diagnosis not present

## 2018-06-27 DIAGNOSIS — M545 Low back pain: Secondary | ICD-10-CM | POA: Diagnosis not present

## 2018-06-27 DIAGNOSIS — M6281 Muscle weakness (generalized): Secondary | ICD-10-CM | POA: Diagnosis not present

## 2018-07-06 DIAGNOSIS — M6281 Muscle weakness (generalized): Secondary | ICD-10-CM | POA: Diagnosis not present

## 2018-07-06 DIAGNOSIS — M545 Low back pain: Secondary | ICD-10-CM | POA: Diagnosis not present

## 2018-07-20 DIAGNOSIS — M545 Low back pain: Secondary | ICD-10-CM | POA: Diagnosis not present

## 2018-07-20 DIAGNOSIS — M6281 Muscle weakness (generalized): Secondary | ICD-10-CM | POA: Diagnosis not present

## 2018-07-27 DIAGNOSIS — M25572 Pain in left ankle and joints of left foot: Secondary | ICD-10-CM | POA: Diagnosis not present

## 2018-07-27 DIAGNOSIS — M545 Low back pain: Secondary | ICD-10-CM | POA: Diagnosis not present

## 2018-07-27 DIAGNOSIS — M6281 Muscle weakness (generalized): Secondary | ICD-10-CM | POA: Diagnosis not present

## 2018-08-04 DIAGNOSIS — M25572 Pain in left ankle and joints of left foot: Secondary | ICD-10-CM | POA: Diagnosis not present

## 2018-08-04 DIAGNOSIS — M6281 Muscle weakness (generalized): Secondary | ICD-10-CM | POA: Diagnosis not present

## 2018-08-04 DIAGNOSIS — M545 Low back pain: Secondary | ICD-10-CM | POA: Diagnosis not present

## 2018-08-12 DIAGNOSIS — M6281 Muscle weakness (generalized): Secondary | ICD-10-CM | POA: Diagnosis not present

## 2018-08-12 DIAGNOSIS — M545 Low back pain: Secondary | ICD-10-CM | POA: Diagnosis not present

## 2018-08-19 DIAGNOSIS — M6281 Muscle weakness (generalized): Secondary | ICD-10-CM | POA: Diagnosis not present

## 2018-08-19 DIAGNOSIS — M545 Low back pain: Secondary | ICD-10-CM | POA: Diagnosis not present

## 2018-08-25 DIAGNOSIS — M25572 Pain in left ankle and joints of left foot: Secondary | ICD-10-CM | POA: Diagnosis not present

## 2018-08-25 DIAGNOSIS — M545 Low back pain: Secondary | ICD-10-CM | POA: Diagnosis not present

## 2018-08-25 DIAGNOSIS — M6281 Muscle weakness (generalized): Secondary | ICD-10-CM | POA: Diagnosis not present

## 2018-09-01 DIAGNOSIS — M6281 Muscle weakness (generalized): Secondary | ICD-10-CM | POA: Diagnosis not present

## 2018-09-01 DIAGNOSIS — M545 Low back pain: Secondary | ICD-10-CM | POA: Diagnosis not present

## 2018-09-08 DIAGNOSIS — M545 Low back pain: Secondary | ICD-10-CM | POA: Diagnosis not present

## 2018-09-08 DIAGNOSIS — M6281 Muscle weakness (generalized): Secondary | ICD-10-CM | POA: Diagnosis not present

## 2018-09-15 DIAGNOSIS — M545 Low back pain: Secondary | ICD-10-CM | POA: Diagnosis not present

## 2018-09-15 DIAGNOSIS — M6281 Muscle weakness (generalized): Secondary | ICD-10-CM | POA: Diagnosis not present

## 2018-09-20 ENCOUNTER — Ambulatory Visit: Payer: BLUE CROSS/BLUE SHIELD | Admitting: Family Medicine

## 2018-09-20 ENCOUNTER — Encounter: Payer: Self-pay | Admitting: Family Medicine

## 2018-09-20 VITALS — BP 106/74 | HR 65 | Temp 98.2°F | Wt 144.5 lb

## 2018-09-20 DIAGNOSIS — M5431 Sciatica, right side: Secondary | ICD-10-CM | POA: Diagnosis not present

## 2018-09-20 NOTE — Progress Notes (Signed)
   Subjective:    Patient ID: Erika [name], female    DOB: [birth year], 37 y.o.   MRN: [id number]  HPI Here for 3 weeks of numbness and tingling in the right leg and right foot. She has had low back pain off and on for years but has never been evaluated for this. Now the pain shoots down the right lower back through the buttock and down the right leg. The numbness is a recent development. No weakness. She is a runner and she is currently training for her third half marathon which is coming up in 2 weeks. She takes Ibuprofen rarely. She has been working with a PT at [facility] in [place] but this has not helped.    Review of Systems  Constitutional: Negative.   Respiratory: Negative.   Cardiovascular: Negative.   Musculoskeletal: Positive for back pain.  Neurological: Positive for numbness. Negative for weakness.       Objective:   Physical Exam  Constitutional: She is oriented to person, place, and time. She appears well-developed and well-nourished.  Cardiovascular: Normal rate, regular rhythm, normal heart sounds and intact distal pulses.  Pulmonary/Chest: Effort normal and breath sounds normal.  Musculoskeletal:  Mildly tender in the right lower back and right sciatic notch. ROM is full. Negative SLR.   Neurological: She is alert and oriented to person, place, and time.          Assessment & Plan:  Right sciatica. We will set up an MRI of the lumbar spine. In the meantime I advised her to walk or ride a bike rather than run for exercise.  Gershon Crane, MD

## 2018-09-22 DIAGNOSIS — M6281 Muscle weakness (generalized): Secondary | ICD-10-CM | POA: Diagnosis not present

## 2018-09-22 DIAGNOSIS — M545 Low back pain: Secondary | ICD-10-CM | POA: Diagnosis not present

## 2018-09-22 DIAGNOSIS — M25572 Pain in left ankle and joints of left foot: Secondary | ICD-10-CM | POA: Diagnosis not present

## 2018-09-26 ENCOUNTER — Ambulatory Visit
Admission: RE | Admit: 2018-09-26 | Discharge: 2018-09-26 | Disposition: A | Discharge status: Home / Self Care | Payer: BLUE CROSS/BLUE SHIELD | Source: Ambulatory Visit | Attending: Family Medicine | Admitting: Family Medicine

## 2018-09-26 DIAGNOSIS — M5431 Sciatica, right side: Secondary | ICD-10-CM

## 2018-09-26 DIAGNOSIS — M545 Low back pain: Secondary | ICD-10-CM | POA: Diagnosis not present

## 2018-09-28 ENCOUNTER — Other Ambulatory Visit: Payer: Self-pay | Admitting: *Deleted

## 2018-09-28 DIAGNOSIS — M5431 Sciatica, right side: Secondary | ICD-10-CM

## 2018-09-29 DIAGNOSIS — M545 Low back pain: Secondary | ICD-10-CM | POA: Diagnosis not present

## 2018-09-29 DIAGNOSIS — M6281 Muscle weakness (generalized): Secondary | ICD-10-CM | POA: Diagnosis not present

## 2018-10-06 DIAGNOSIS — M545 Low back pain: Secondary | ICD-10-CM | POA: Diagnosis not present

## 2018-10-06 DIAGNOSIS — M6281 Muscle weakness (generalized): Secondary | ICD-10-CM | POA: Diagnosis not present

## 2018-10-13 DIAGNOSIS — M545 Low back pain: Secondary | ICD-10-CM | POA: Diagnosis not present

## 2018-10-13 DIAGNOSIS — M6281 Muscle weakness (generalized): Secondary | ICD-10-CM | POA: Diagnosis not present

## 2018-10-14 ENCOUNTER — Encounter

## 2018-10-16 NOTE — Progress Notes (Signed)
[name] [address] [place], [place] [address] Phone: [phone] Subjective:    I'm seeing this patient by the request  of:  Nelwyn Salisbury, MD   CC: Back pain with right-sided leg pain  ONG:[id number]  Erika [name] is a 37 y.o. female coming in with complaint of chronic back pain since she was 37 years old. In the past 2 years she has taken up running. Her back pain increased. Right leg and foot became numb during her runs. She ran a 1/2 marathon on 10/13. She feels are back pain has increased. Has been doing physical therapy for 4 months.Had MRI on 09/26/18. Pain is now constant. Pain with sitting, standing, lifting. Numbness and tingling now going down both legs now. Is using IBU to treat symptoms. Uses heat at night and icing PRN.     Patient did have an MRI taken September 26, 2018 MRI showed mild disc disease at L5-S1 with a left-sided S1 nerve root impingement.  Past Medical History:  Diagnosis Date  . Allergy   . Asthma    No past surgical history on file. Social History   Socioeconomic History  . Marital status: Married    Spouse name: Not on file  . Number of children: Not on file  . Years of education: Not on file  . Highest education level: Not on file  Occupational History  . Not on file  Social Needs  . Financial resource strain: Not on file  . Food insecurity:    Worry: Not on file    Inability: Not on file  . Transportation needs:    Medical: Not on file    Non-medical: Not on file  Tobacco Use  . Smoking status: Never Smoker  . Smokeless tobacco: Never Used  Substance and Sexual Activity  . Alcohol use: No    Alcohol/week: 0.0 standard drinks  . Drug use: No  . Sexual activity: Not on file  Lifestyle  . Physical activity:    Days per week: Not on file    Minutes per session: Not on file  . Stress: Not on file  Relationships  . Social connections:    Talks on phone: Not on file    Gets together: Not on file      Attends religious service: Not on file    Active member of club or organization: Not on file    Attends meetings of clubs or organizations: Not on file    Relationship status: Not on file  Other Topics Concern  . Not on file  Social History Narrative  . Not on file   Allergies  Allergen Reactions  . Codeine   . Erythromycin     Severe stomach pain   No family history on file.    Current Outpatient Medications (Respiratory):  .  albuterol (VENTOLIN HFA) 108 (90 BASE) MCG/ACT inhaler, Inhale 2 puffs into the lungs every 4 (four) hours as needed for wheezing or shortness of breath.       Past medical history, social, surgical and family history all reviewed in electronic medical record.  No pertanent information unless stated regarding to the chief complaint.   Review of Systems:  No headache, visual changes, nausea, vomiting, diarrhea, constipation, dizziness, abdominal pain, skin rash, fevers, chills, night sweats, weight loss, swollen lymph nodes, body aches, joint swelling, muscle aches, chest pain, shortness of breath, mood changes.   Objective  Blood pressure 118/82, pulse (!) 57, height 5'  7" (1.702 m), weight 144 lb (65.3 kg), SpO2 99 %.    General: No apparent distress alert and oriented x3 mood and affect normal, dressed appropriately.  HEENT: Pupils equal, extraocular movements intact  Respiratory: Patient's speak in full sentences and does not appear short of breath  Cardiovascular: No lower extremity edema, non tender, no erythema  Skin: Warm dry intact with no signs of infection or rash on extremities or on axial skeleton.  Abdomen: Soft nontender  Neuro: Cranial nerves II through XII are intact, neurovascularly intact in all extremities with 2+ DTRs and 2+ pulses.  Lymph: No lymphadenopathy of posterior or anterior cervical chain or axillae bilaterally.  Gait normal with good balance and coordination.  MSK:  Non tender with full range of motion and good  stability and symmetric strength and tone of shoulders, elbows, wrist, hip, knee and ankles bilaterally.  Back Exam:  Inspection: Loss of lordosis Motion: Flexion 35 deg, Extension 35 deg, Side Bending to 35 deg bilaterally,  Rotation to 35 deg bilaterally  SLR laying: Negative  XSLR laying: Negative  Palpable tenderness: Tender to palpation the paraspinal musculature.  More on the right than the left. FABER: right . Sensory change: Gross sensation intact to all lumbar and sacral dermatomes.  Reflexes: 2+ at both patellar tendons, 2+ at achilles tendons, Babinski's downgoing.  Strength at foot  Plantar-flexion: 5/5 Dorsi-flexion: 5/5 Eversion: 5/5 Inversion: 5/5  Leg strength  Quad: 5/5 Hamstring: 5/5 Hip flexor: 5/5 Hip abductors: 4/5 but symmetric Gait unremarkable.  Osteopathic findings C2 flexed rotated and side bent right T4 extended rotated and side bent left L1 flexed rotated and side bent right Sacrum left on left right posterior ilium    97110; 15 additional minutes spent for Therapeutic exercises as stated in above notes.  This included exercises focusing on stretching, strengthening, with significant focus on eccentric aspects.   Long term goals include an improvement in range of motion, strength, endurance as well as avoiding reinjury. Patient's frequency would include in 1-2 times a day, 3-5 times a week for a duration of 6-12 weeks. Low back exercises that included:  Pelvic tilt/bracing instruction to focus on control of the pelvic girdle and lower abdominal muscles  Glute strengthening exercises, focusing on proper firing of the glutes without engaging the low back muscles Proper stretching techniques for maximum relief for the hamstrings, hip flexors, low back and some rotation where tolerated  Proper technique shown and discussed handout in great detail with ATC.  All questions were discussed and answered.   Impression and Recommendations:     The above  documentation has been reviewed and is accurate and complete Judi Saa, DO       Note: This dictation was prepared with Dragon dictation along with smaller phrase technology. Any transcriptional errors that result from this process are unintentional.

## 2018-10-17 ENCOUNTER — Encounter: Payer: Self-pay | Admitting: Family Medicine

## 2018-10-17 ENCOUNTER — Ambulatory Visit: Payer: BLUE CROSS/BLUE SHIELD | Admitting: Family Medicine

## 2018-10-17 DIAGNOSIS — M999 Biomechanical lesion, unspecified: Secondary | ICD-10-CM

## 2018-10-17 DIAGNOSIS — M533 Sacrococcygeal disorders, not elsewhere classified: Secondary | ICD-10-CM

## 2018-10-17 NOTE — Patient Instructions (Signed)
Good to see you  Hip flexor exercises Ice 20 minutes 2 times daily. Usually after activity and before bed. Exercises 3 times a week.  pennsaid pinkie amount topically 2 times daily as needed.   Over the counter Vitamin D 2000 IU daily  See me again in 3 weeks to make sure you are doing better and will discuss if injections are needed

## 2018-10-17 NOTE — Assessment & Plan Note (Signed)
Patient is more of a sacroiliac dysfunction.  Patient's symptoms do not correspond with the MRI.  We discussed with her about the possibility of osteopathic manipulation and patient to try today.  Responded fairly well.  Discussed icing regimen and home exercises.  Discussed which activities to do which was to avoid.  Increase activity slowly over the course the next several days.  Avoid running still at this time.  Follow-up again in 3 to 4 weeks.  If it fails we will consider a nerve injection.  Once again do not think that it is contributing at this time though.

## 2018-10-17 NOTE — Assessment & Plan Note (Signed)
Decision today to treat with OMT was based on Physical Exam  After verbal consent patient was treated with HVLA, ME, FPR techniques in cervical, thoracic, pelvis, lumbar and sacral areas  Patient tolerated the procedure well with improvement in symptoms  Patient given exercises, stretches and lifestyle modifications  See medications in patient instructions if given  Patient will follow up in 4-8 weeks 

## 2018-10-20 DIAGNOSIS — M545 Low back pain: Secondary | ICD-10-CM | POA: Diagnosis not present

## 2018-10-20 DIAGNOSIS — M6281 Muscle weakness (generalized): Secondary | ICD-10-CM | POA: Diagnosis not present

## 2018-10-25 DIAGNOSIS — M6281 Muscle weakness (generalized): Secondary | ICD-10-CM | POA: Diagnosis not present

## 2018-10-25 DIAGNOSIS — M545 Low back pain: Secondary | ICD-10-CM | POA: Diagnosis not present

## 2018-11-03 DIAGNOSIS — Z1151 Encounter for screening for human papillomavirus (HPV): Secondary | ICD-10-CM | POA: Diagnosis not present

## 2018-11-03 DIAGNOSIS — Z130 Encounter for screening for diseases of the blood and blood-forming organs and certain disorders involving the immune mechanism: Secondary | ICD-10-CM | POA: Diagnosis not present

## 2018-11-03 DIAGNOSIS — Z01419 Encounter for gynecological examination (general) (routine) without abnormal findings: Secondary | ICD-10-CM | POA: Diagnosis not present

## 2018-11-03 DIAGNOSIS — Z124 Encounter for screening for malignant neoplasm of cervix: Secondary | ICD-10-CM | POA: Diagnosis not present

## 2018-11-03 DIAGNOSIS — Z6822 Body mass index (BMI) 22.0-22.9, adult: Secondary | ICD-10-CM | POA: Diagnosis not present

## 2018-11-03 DIAGNOSIS — Z0000 Encounter for general adult medical examination without abnormal findings: Secondary | ICD-10-CM | POA: Diagnosis not present

## 2018-11-06 NOTE — Progress Notes (Signed)
[name] [address] [place], [place] [address] Phone: [phone] Subjective:    I [name] am serving as a Neurosurgeon for Dr. Antoine Primas.    CC: Back pain follow-up  NGE:[id number]  RUSSELL ENGELSTAD is a 37 y.o. female coming in with complaint of SI joint dysfunction. Still in pain. Feels better from her last visit. Able to accomplish her ADLs.  Patient does have an MRI showing the patient did have S1 nerve impingement.  Patient states that the soreness still seems to be mostly on the left side but radicular symptoms can go bilaterally.  Patient continues to try to be active at home.  Patient did feel that manipulation was helpful at last exam.  Denies any new symptoms discontinuation of the previous symptoms.     Past Medical History:  Diagnosis Date  . Allergy   . Asthma    No past surgical history on file. Social History   Socioeconomic History  . Marital status: Married    Spouse name: Not on file  . Number of children: Not on file  . Years of education: Not on file  . Highest education level: Not on file  Occupational History  . Not on file  Social Needs  . Financial resource strain: Not on file  . Food insecurity:    Worry: Not on file    Inability: Not on file  . Transportation needs:    Medical: Not on file    Non-medical: Not on file  Tobacco Use  . Smoking status: Never Smoker  . Smokeless tobacco: Never Used  Substance and Sexual Activity  . Alcohol use: No    Alcohol/week: 0.0 standard drinks  . Drug use: No  . Sexual activity: Not on file  Lifestyle  . Physical activity:    Days per week: Not on file    Minutes per session: Not on file  . Stress: Not on file  Relationships  . Social connections:    Talks on phone: Not on file    Gets together: Not on file    Attends religious service: Not on file    Active member of club or organization: Not on file    Attends meetings of clubs or organizations: Not on  file    Relationship status: Not on file  Other Topics Concern  . Not on file  Social History Narrative  . Not on file   Allergies  Allergen Reactions  . Codeine   . Erythromycin     Severe stomach pain   No family history on file.  Current Outpatient Medications (Endocrine & Metabolic):  .  Estradiol-Norethindrone Acet (MIMVEY LO) 0.5-0.1 MG tablet, Take 1 tablet by mouth daily.   Current Outpatient Medications (Respiratory):  .  albuterol (VENTOLIN HFA) 108 (90 BASE) MCG/ACT inhaler, Inhale 2 puffs into the lungs every 4 (four) hours as needed for wheezing or shortness of breath.       Past medical history, social, surgical and family history all reviewed in electronic medical record.  No pertanent information unless stated regarding to the chief complaint.   Review of Systems:  No headache, visual changes, nausea, vomiting, diarrhea, constipation, dizziness, abdominal pain, skin rash, fevers, chills, night sweats, weight loss, swollen lymph nodes, body aches, joint swelling, hest pain, shortness of breath, mood changes.  Positive muscle aches  Objective  Blood pressure 116/80, pulse 65, height 5\' 7"  (1.702 m), weight 145 lb (65.8 kg), SpO2 99 %.  General: No apparent distress alert and oriented x3 mood and affect normal, dressed appropriately.  HEENT: Pupils equal, extraocular movements intact  Respiratory: Patient's speak in full sentences and does not appear short of breath  Cardiovascular: No lower extremity edema, non tender, no erythema  Skin: Warm dry intact with no signs of infection or rash on extremities or on axial skeleton.  Abdomen: Soft nontender  Neuro: Cranial nerves II through XII are intact, neurovascularly intact in all extremities with 2+ DTRs and 2+ pulses.  Lymph: No lymphadenopathy of posterior or anterior cervical chain or axillae bilaterally.  Gait normal with good balance and coordination.  MSK:  Non tender with full range of motion and good  stability and symmetric strength and tone of shoulders, elbows, wrist, hip, knee and ankles bilaterally.  Back Exam:  Inspection: Unremarkable  Motion: Flexion 45 deg, Extension 25 deg, Side Bending to 45 deg bilaterally, Rotation to 35 deg bilaterally  SLR laying: Negative  XSLR laying: Negative  Palpable tenderness: Tender to palpation of the paraspinal musculature in the lumbar spine right greater than left. FABER: Positive faber. Sensory change: Gross sensation intact to all lumbar and sacral dermatomes.  Reflexes: 2+ at both patellar tendons, 2+ at achilles tendons, Babinski's downgoing.  Strength at foot  Plantar-flexion: 5/5 Dorsi-flexion: 5/5 Eversion: 5/5 Inversion: 5/5  Leg strength  Quad: 5/5 Hamstring: 5/5 Hip flexor: 5/5 Hip abductors: 4/5 but symmetric  Osteopathic findings  C2 flexed rotated and side bent right C4 flexed rotated and side bent left C7 flexed rotated and side bent left T3 extended rotated and side bent right inhaled third rib T6 extended rotated and side bent left L4 flexed rotated and side bent left  Sacrum right on right Pelvic shear left   Impression and Recommendations:     This case required medical decision making of moderate complexity. The above documentation has been reviewed and is accurate and complete Judi SaaZachary M Dondrea Clendenin, DO       Note: This dictation was prepared with Dragon dictation along with smaller phrase technology. Any transcriptional errors that result from this process are unintentional.

## 2018-11-07 ENCOUNTER — Ambulatory Visit: Payer: BLUE CROSS/BLUE SHIELD | Admitting: Family Medicine

## 2018-11-07 ENCOUNTER — Encounter: Payer: Self-pay | Admitting: Family Medicine

## 2018-11-07 VITALS — BP 116/80 | HR 65 | Ht 67.0 in | Wt 145.0 lb

## 2018-11-07 DIAGNOSIS — G8929 Other chronic pain: Secondary | ICD-10-CM

## 2018-11-07 DIAGNOSIS — M545 Low back pain, unspecified: Secondary | ICD-10-CM

## 2018-11-07 DIAGNOSIS — M5416 Radiculopathy, lumbar region: Secondary | ICD-10-CM

## 2018-11-07 DIAGNOSIS — M999 Biomechanical lesion, unspecified: Secondary | ICD-10-CM | POA: Diagnosis not present

## 2018-11-07 NOTE — Assessment & Plan Note (Signed)
Decision today to treat with OMT was based on Physical Exam  After verbal consent patient was treated with HVLA, ME, FPR techniques in cervical, thoracic, lumbar and pelvis and sacral areas  Patient tolerated the procedure well with improvement in symptoms  Patient given exercises, stretches and lifestyle modifications  See medications in patient instructions if given  Patient will follow up in 4-6 weeks

## 2018-11-07 NOTE — Patient Instructions (Signed)
Good to see you  We will try an epidural  Call [phone] to schedule Ice is your friend Stay active OK to do the 5K See me again in 4 weeks

## 2018-11-07 NOTE — Assessment & Plan Note (Signed)
MRI is consistent with a nerve impingement.  Patient has done fairly well with conservative therapy but will send for an epidural injection that I think will be beneficial as well.  We discussed icing regimen, home exercise, which activities doing which wants to avoid.  Patient is to increase activity slowly over the course the next several days to months.  Discussed after the epidural to start manipulation again.  Think patient will do well.  Follow-up with me again in 4 weeks

## 2018-11-08 DIAGNOSIS — M6281 Muscle weakness (generalized): Secondary | ICD-10-CM | POA: Diagnosis not present

## 2018-11-08 DIAGNOSIS — M545 Low back pain: Secondary | ICD-10-CM | POA: Diagnosis not present

## 2018-11-15 ENCOUNTER — Other Ambulatory Visit: Payer: BLUE CROSS/BLUE SHIELD

## 2018-11-16 DIAGNOSIS — M545 Low back pain: Secondary | ICD-10-CM | POA: Diagnosis not present

## 2018-11-16 DIAGNOSIS — M6281 Muscle weakness (generalized): Secondary | ICD-10-CM | POA: Diagnosis not present

## 2018-11-22 ENCOUNTER — Ambulatory Visit
Admission: RE | Admit: 2018-11-22 | Discharge: 2018-11-22 | Disposition: A | Discharge status: Home / Self Care | Payer: BLUE CROSS/BLUE SHIELD | Source: Ambulatory Visit | Attending: Family Medicine | Admitting: Family Medicine

## 2018-11-22 ENCOUNTER — Encounter: Payer: Self-pay | Admitting: Radiology

## 2018-11-22 DIAGNOSIS — M545 Low back pain, unspecified: Secondary | ICD-10-CM

## 2018-11-22 MED ORDER — IOPAMIDOL (ISOVUE-M 200) INJECTION 41%
1.0000 mL | Freq: Once | INTRAMUSCULAR | Status: AC
  Administered 2018-11-22: 1 mL via EPIDURAL

## 2018-11-22 MED ORDER — METHYLPREDNISOLONE ACETATE 40 MG/ML INJ SUSP (RADIOLOG
120.0000 mg | Freq: Once | INTRAMUSCULAR | Status: AC
  Administered 2018-11-22: 120 mg via EPIDURAL

## 2018-11-22 NOTE — Discharge Instructions (Signed)

## 2018-11-29 DIAGNOSIS — M6281 Muscle weakness (generalized): Secondary | ICD-10-CM | POA: Diagnosis not present

## 2018-11-29 DIAGNOSIS — M545 Low back pain: Secondary | ICD-10-CM | POA: Diagnosis not present

## 2018-12-07 ENCOUNTER — Ambulatory Visit: Payer: BLUE CROSS/BLUE SHIELD | Admitting: Family Medicine

## 2018-12-07 ENCOUNTER — Encounter: Payer: Self-pay | Admitting: Family Medicine

## 2018-12-07 VITALS — BP 106/70 | HR 70 | Ht 67.0 in | Wt 147.0 lb

## 2018-12-07 DIAGNOSIS — M9902 Segmental and somatic dysfunction of thoracic region: Secondary | ICD-10-CM

## 2018-12-07 DIAGNOSIS — M9904 Segmental and somatic dysfunction of sacral region: Secondary | ICD-10-CM

## 2018-12-07 DIAGNOSIS — M533 Sacrococcygeal disorders, not elsewhere classified: Secondary | ICD-10-CM

## 2018-12-07 DIAGNOSIS — M999 Biomechanical lesion, unspecified: Secondary | ICD-10-CM

## 2018-12-07 DIAGNOSIS — M9901 Segmental and somatic dysfunction of cervical region: Secondary | ICD-10-CM

## 2018-12-07 DIAGNOSIS — M9905 Segmental and somatic dysfunction of pelvic region: Secondary | ICD-10-CM

## 2018-12-07 DIAGNOSIS — M9903 Segmental and somatic dysfunction of lumbar region: Secondary | ICD-10-CM

## 2018-12-07 DIAGNOSIS — M9908 Segmental and somatic dysfunction of rib cage: Secondary | ICD-10-CM

## 2018-12-07 NOTE — Assessment & Plan Note (Signed)
More secondary to the sacroiliac dysfunction.  Patient at this time is making progress.  We will have patient start on running progression.  Given today.  Discussed home exercise, which activities to do which was to avoid.  Patient will increase activity slowly over the course the next several days.  Follow-up with me again in 4 to 8 weeks

## 2018-12-07 NOTE — Progress Notes (Signed)
Erika ScaleZach Eduard [name] D.O. [facility] [address] [place], [place] [address] Phone: [phone] Subjective:    I Erika NighKana [name] am serving as a Neurosurgeonscribe for Dr. Antoine PrimasZachary Berk [name].   I'm seeing this patient by the request  of:    CC: Back and hip pain follow-up  DGL:[id number]:Subjective  Erika [name] is a 37 y.o. female coming in with complaint of hip pain. States she feels a lot better since the injections. Last 2 days felt great. Today she has slight pain and discomfort. Tingling has improved but is still there. Overall improvement. Patient would state about 60% better.  Patient is optimistic at this moment.  Would like to see if she could potentially start running in the near future     Past Medical History:  Diagnosis Date  . Allergy   . Asthma    No past surgical history on file. Social History   Socioeconomic History  . Marital status: Married    Spouse name: Not on file  . Number of children: Not on file  . Years of education: Not on file  . Highest education level: Not on file  Occupational History  . Not on file  Social Needs  . Financial resource strain: Not on file  . Food insecurity:    Worry: Not on file    Inability: Not on file  . Transportation needs:    Medical: Not on file    Non-medical: Not on file  Tobacco Use  . Smoking status: Never Smoker  . Smokeless tobacco: Never Used  Substance and Sexual Activity  . Alcohol use: No    Alcohol/week: 0.0 standard drinks  . Drug use: No  . Sexual activity: Not on file  Lifestyle  . Physical activity:    Days per week: Not on file    Minutes per session: Not on file  . Stress: Not on file  Relationships  . Social connections:    Talks on phone: Not on file    Gets together: Not on file    Attends religious service: Not on file    Active member of club or organization: Not on file    Attends meetings of clubs or organizations: Not on file    Relationship status: Not on file  Other Topics Concern  . Not  on file  Social History Narrative  . Not on file   Allergies  Allergen Reactions  . Codeine   . Erythromycin     Severe stomach pain   No family history on file.  Current Outpatient Medications (Endocrine & Metabolic):  .  Estradiol-Norethindrone Acet (MIMVEY LO) 0.5-0.1 MG tablet, Take 1 tablet by mouth daily.   Current Outpatient Medications (Respiratory):  .  albuterol (VENTOLIN HFA) 108 (90 BASE) MCG/ACT inhaler, Inhale 2 puffs into the lungs every 4 (four) hours as needed for wheezing or shortness of breath.       Past medical history, social, surgical and family history all reviewed in electronic medical record.  No pertanent information unless stated regarding to the chief complaint.   Review of Systems:  No headache, visual changes, nausea, vomiting, diarrhea, constipation, dizziness, abdominal pain, skin rash, fevers, chills, night sweats, weight loss, swollen lymph nodes, body aches, joint swelling, muscle aches, chest pain, shortness of breath, mood changes.   Objective  There were no vitals taken for this visit. Systems examined below as of    General: No apparent distress alert and oriented x3 mood and affect normal, dressed appropriately.  HEENT: Pupils equal, extraocular movements intact  Respiratory: Patient's speak in full sentences and does not appear short of breath  Cardiovascular: No lower extremity edema, non tender, no erythema  Skin: Warm dry intact with no signs of infection or rash on extremities or on axial skeleton.  Abdomen: Soft nontender  Neuro: Cranial nerves II through XII are intact, neurovascularly intact in all extremities with 2+ DTRs and 2+ pulses.  Lymph: No lymphadenopathy of posterior or anterior cervical chain or axillae bilaterally.  Gait normal with good balance and coordination.  MSK:  Non tender with full range of motion and good stability and symmetric strength and tone of shoulders, elbows, wrist,  knee and ankles bilaterally.    Back Exam:  Inspection: Loss of lordosis Motion: Flexion 45 deg, Extension 25 deg, Side Bending to 35 deg bilaterally,  Rotation to 30 deg bilaterally  SLR laying: Negative  XSLR laying: Negative  Palpable tenderness: Tender to palpation the paraspinal musculature lumbar spine right greater than left. FABER: Positive Faber on the right. Sensory change: Gross sensation intact to all lumbar and sacral dermatomes.  Reflexes: 2+ at both patellar tendons, 2+ at achilles tendons, Babinski's downgoing.  Strength at foot  Plantar-flexion: 5/5 Dorsi-flexion: 5/5 Eversion: 5/5 Inversion: 5/5  Leg strength  Quad: 5/5 Hamstring: 5/5 Hip flexor: 5/5 Hip abductors: 4/5 symmetric   Osteopathic findings C2 flexed rotated and side bent right C7 flexed rotated and side bent right  T3 extended rotated and side bent right inhaled third rib T7 extended rotated and side bent left L3 flexed rotated and side bent left  Sacrum right on right Pelvic shear noted    Impression and Recommendations:     This case required medical decision making of moderate complexity. The above documentation has been reviewed and is accurate and complete Judi Saa, DO       Note: This dictation was prepared with Dragon dictation along with smaller phrase technology. Any transcriptional errors that result from this process are unintentional.

## 2018-12-07 NOTE — Patient Instructions (Signed)
Good to see you  You are doing great  I am proud of you  Start a walk-run progression: Start 30 minutes and only 3 times a week max  Once you have reached 30 mins: - Run 2 mins, then walk 1 min weeks 1. -Then run 3 mins, and walk 1 min week 2 -Then run 4 mins, and walk 1 min week 3  -Then run 5 mins, and walk 1 min weeks 4  -Slowly build up weekly to running 30 mins nonstop.  If painful at any of the steps, back up one step.  Sewe me again in 6ish weeks

## 2018-12-07 NOTE — Assessment & Plan Note (Addendum)
Decision today to treat with OMT was based on Physical Exam  After verbal consent patient was treated with HVLA, ME, FPR techniques in cervical, thoracic, rib, lumbar and sacral and pelvis  areas  Patient tolerated the procedure well with improvement in symptoms  Patient given exercises, stretches and lifestyle modifications  See medications in patient instructions if given  Patient will follow up in 4-8 weeks 

## 2018-12-08 DIAGNOSIS — M545 Low back pain: Secondary | ICD-10-CM | POA: Diagnosis not present

## 2018-12-08 DIAGNOSIS — M6281 Muscle weakness (generalized): Secondary | ICD-10-CM | POA: Diagnosis not present

## 2018-12-26 ENCOUNTER — Encounter: Payer: Self-pay | Admitting: Family Medicine

## 2018-12-28 DIAGNOSIS — M545 Low back pain: Secondary | ICD-10-CM | POA: Diagnosis not present

## 2018-12-28 DIAGNOSIS — M6281 Muscle weakness (generalized): Secondary | ICD-10-CM | POA: Diagnosis not present

## 2019-01-03 DIAGNOSIS — M6281 Muscle weakness (generalized): Secondary | ICD-10-CM | POA: Diagnosis not present

## 2019-01-03 DIAGNOSIS — M545 Low back pain: Secondary | ICD-10-CM | POA: Diagnosis not present

## 2019-01-10 DIAGNOSIS — M545 Low back pain: Secondary | ICD-10-CM | POA: Diagnosis not present

## 2019-01-10 DIAGNOSIS — M6281 Muscle weakness (generalized): Secondary | ICD-10-CM | POA: Diagnosis not present

## 2019-01-10 NOTE — Progress Notes (Signed)
Tawana ScaleZach Smith D.O.  Sports Medicine [address] [place], [place] [address] Phone: [phone] Subjective:   Erika [name], Erika [name], am serving as a scribe for Dr. Antoine PrimasZachary Smith.   CC: Neck pain follow-up  BJY:[id number]:Subjective  Erika [name] is a 38 y.o. female coming in with complaint of back pain. Had epidural on 11/22/2018. Pain decreased following epidural but is still having right radicular symptoms into right foot. Pain in lumbar spine is now slowly coming back. Is doing physical therapy and had some relief with manual therapy yesterday. Her back is now progressively hurting each day. Her foot continues to be numb all the time but running exacerbates the symptoms. Was doing running progression but go frustrated because she was unable to do run walk up hill as this increased her pain.       Past Medical History:  Diagnosis Date  . Allergy   . Asthma    No past surgical history on file. Social History   Socioeconomic History  . Marital status: Married    Spouse name: Not on file  . Number of children: Not on file  . Years of education: Not on file  . Highest education level: Not on file  Occupational History  . Not on file  Social Needs  . Financial resource strain: Not on file  . Food insecurity:    Worry: Not on file    Inability: Not on file  . Transportation needs:    Medical: Not on file    Non-medical: Not on file  Tobacco Use  . Smoking status: Never Smoker  . Smokeless tobacco: Never Used  Substance and Sexual Activity  . Alcohol use: No    Alcohol/week: 0.0 standard drinks  . Drug use: No  . Sexual activity: Not on file  Lifestyle  . Physical activity:    Days per week: Not on file    Minutes per session: Not on file  . Stress: Not on file  Relationships  . Social connections:    Talks on phone: Not on file    Gets together: Not on file    Attends religious service: Not on file    Active member of club or organization: Not on file    Attends  meetings of clubs or organizations: Not on file    Relationship status: Not on file  Other Topics Concern  . Not on file  Social History Narrative  . Not on file   Allergies  Allergen Reactions  . Codeine   . Erythromycin     Severe stomach pain   No family history on file.  Current Outpatient Medications (Endocrine & Metabolic):  .  Estradiol-Norethindrone Acet (MIMVEY LO) 0.5-0.1 MG tablet, Take 1 tablet by mouth daily.   Current Outpatient Medications (Respiratory):  .  albuterol (VENTOLIN HFA) 108 (90 BASE) MCG/ACT inhaler, Inhale 2 puffs into the lungs every 4 (four) hours as needed for wheezing or shortness of breath.       Past medical history, social, surgical and family history all reviewed in electronic medical record.  No pertanent information unless stated regarding to the chief complaint.   Review of Systems:  No headache, visual changes, nausea, vomiting, diarrhea, constipation, dizziness, abdominal pain, skin rash, fevers, chills, night sweats, weight loss, swollen lymph nodes, body aches, joint swelling, muscle aches, chest pain, shortness of breath, mood changes.   Objective  There were no vitals taken for this visit. Systems examined below as of    General: No  apparent distress alert and oriented x3 mood and affect normal, dressed appropriately.  HEENT: Pupils equal, extraocular movements intact  Respiratory: Patient's speak in full sentences and does not appear short of breath  Cardiovascular: No lower extremity edema, non tender, no erythema  Skin: Warm dry intact with no signs of infection or rash on extremities or on axial skeleton.  Abdomen: Soft nontender  Neuro: Cranial nerves II through XII are intact, neurovascularly intact in all extremities with 2+ DTRs and 2+ pulses.  Lymph: No lymphadenopathy of posterior or anterior cervical chain or axillae bilaterally.  Gait normal with good balance and coordination.  MSK:  Non tender with full range of  motion and good stability and symmetric strength and tone of shoulders, elbows, wrist, hip, knee and ankles bilaterally.  Back Exam:  Inspection: Unremarkable  Motion: Flexion 35 deg, Extension 25 deg, Side Bending to 35 deg bilaterally,  Rotation to 30 deg bilaterally  SLR laying: Positive right XSLR laying: Negative  Palpable tenderness: None more tenderness over the right piriformis. FABER: Mild positive right. Sensory change: Gross sensation intact to all lumbar and sacral dermatomes.  Reflexes: 2+ at both patellar tendons, 2+ at achilles tendons, Babinski's downgoing.  Strength at foot  Plantar-flexion: 5/5 Dorsi-flexion: 5/5 Eversion: 5/5 Inversion: 5/5  Leg strength  Quad: 5/5 Hamstring: 5/5 Hip flexor: 5/5 Hip abductors: 5/5  Gait unremarkable.      Impression and Recommendations:     This case required medical decision making of moderate complexity. The above documentation has been reviewed and is accurate and complete Judi SaaZachary M Smith, DO       Note: This dictation was prepared with Dragon dictation along with smaller phrase technology. Any transcriptional errors that result from this process are unintentional.

## 2019-01-11 ENCOUNTER — Encounter: Payer: Self-pay | Admitting: Family Medicine

## 2019-01-11 ENCOUNTER — Ambulatory Visit: Payer: BLUE CROSS/BLUE SHIELD | Admitting: Family Medicine

## 2019-01-11 VITALS — BP 104/72 | HR 65 | Ht 67.0 in | Wt 149.0 lb

## 2019-01-11 DIAGNOSIS — M5416 Radiculopathy, lumbar region: Secondary | ICD-10-CM

## 2019-01-11 NOTE — Patient Instructions (Signed)
Good to see you  We will try another injection I think we are onto something Call [phone] to schedule.  If not perfect see me again in 2-3 weeks AFTER the injection and we can try a pirformis injection but I need you to have a driver.  We may also need an EMG if it continues but I am very hopeful we are close.

## 2019-01-11 NOTE — Assessment & Plan Note (Signed)
Patient had mild improvement initially appearing and has had an MRI showing a left-sided S1 nerve impingement even though patient has right-sided symptoms.  Encourage patient attempt another epidural with patient having a 4-day near complete resolution of pain immediately.  We will see if patient responds for longer duration.  Rest icing regimen and home exercise.  Discussed which activities to do which was to avoid.  Follow-up again 3 weeks after epidural.  If no improvement consider nerve conduction study or the possibility of a trial of a right-sided piriformis injection.

## 2019-01-17 DIAGNOSIS — M545 Low back pain: Secondary | ICD-10-CM | POA: Diagnosis not present

## 2019-01-17 DIAGNOSIS — M6281 Muscle weakness (generalized): Secondary | ICD-10-CM | POA: Diagnosis not present

## 2019-01-25 DIAGNOSIS — M6281 Muscle weakness (generalized): Secondary | ICD-10-CM | POA: Diagnosis not present

## 2019-01-25 DIAGNOSIS — M545 Low back pain: Secondary | ICD-10-CM | POA: Diagnosis not present

## 2019-01-31 ENCOUNTER — Ambulatory Visit
Admission: RE | Admit: 2019-01-31 | Discharge: 2019-01-31 | Disposition: A | Discharge status: Home / Self Care | Payer: BLUE CROSS/BLUE SHIELD | Source: Ambulatory Visit | Attending: Family Medicine | Admitting: Family Medicine

## 2019-01-31 DIAGNOSIS — M5416 Radiculopathy, lumbar region: Secondary | ICD-10-CM

## 2019-01-31 DIAGNOSIS — M4727 Other spondylosis with radiculopathy, lumbosacral region: Secondary | ICD-10-CM | POA: Diagnosis not present

## 2019-01-31 MED ORDER — IOPAMIDOL (ISOVUE-M 200) INJECTION 41%
1.0000 mL | Freq: Once | INTRAMUSCULAR | Status: AC
  Administered 2019-01-31: 1 mL via EPIDURAL

## 2019-01-31 MED ORDER — METHYLPREDNISOLONE ACETATE 40 MG/ML INJ SUSP (RADIOLOG
120.0000 mg | Freq: Once | INTRAMUSCULAR | Status: AC
  Administered 2019-01-31: 120 mg via EPIDURAL

## 2019-02-08 DIAGNOSIS — M6281 Muscle weakness (generalized): Secondary | ICD-10-CM | POA: Diagnosis not present

## 2019-02-08 DIAGNOSIS — M545 Low back pain: Secondary | ICD-10-CM | POA: Diagnosis not present

## 2019-02-14 DIAGNOSIS — M545 Low back pain: Secondary | ICD-10-CM | POA: Diagnosis not present

## 2019-02-14 DIAGNOSIS — M6281 Muscle weakness (generalized): Secondary | ICD-10-CM | POA: Diagnosis not present

## 2019-02-21 DIAGNOSIS — M6281 Muscle weakness (generalized): Secondary | ICD-10-CM | POA: Diagnosis not present

## 2019-02-21 DIAGNOSIS — M545 Low back pain: Secondary | ICD-10-CM | POA: Diagnosis not present

## 2019-02-28 DIAGNOSIS — M545 Low back pain: Secondary | ICD-10-CM | POA: Diagnosis not present

## 2019-02-28 DIAGNOSIS — M6281 Muscle weakness (generalized): Secondary | ICD-10-CM | POA: Diagnosis not present

## 2019-08-17 DIAGNOSIS — R262 Difficulty in walking, not elsewhere classified: Secondary | ICD-10-CM | POA: Diagnosis not present

## 2019-08-17 DIAGNOSIS — M6281 Muscle weakness (generalized): Secondary | ICD-10-CM | POA: Diagnosis not present

## 2019-08-17 DIAGNOSIS — M545 Low back pain: Secondary | ICD-10-CM | POA: Diagnosis not present

## 2019-08-21 DIAGNOSIS — R262 Difficulty in walking, not elsewhere classified: Secondary | ICD-10-CM | POA: Diagnosis not present

## 2019-08-21 DIAGNOSIS — M545 Low back pain: Secondary | ICD-10-CM | POA: Diagnosis not present

## 2019-08-21 DIAGNOSIS — M6281 Muscle weakness (generalized): Secondary | ICD-10-CM | POA: Diagnosis not present

## 2019-08-24 DIAGNOSIS — M545 Low back pain: Secondary | ICD-10-CM | POA: Diagnosis not present

## 2019-08-24 DIAGNOSIS — R262 Difficulty in walking, not elsewhere classified: Secondary | ICD-10-CM | POA: Diagnosis not present

## 2019-08-24 DIAGNOSIS — M6281 Muscle weakness (generalized): Secondary | ICD-10-CM | POA: Diagnosis not present

## 2019-09-01 DIAGNOSIS — M545 Low back pain: Secondary | ICD-10-CM | POA: Diagnosis not present

## 2019-09-01 DIAGNOSIS — R262 Difficulty in walking, not elsewhere classified: Secondary | ICD-10-CM | POA: Diagnosis not present

## 2019-09-01 DIAGNOSIS — M6281 Muscle weakness (generalized): Secondary | ICD-10-CM | POA: Diagnosis not present

## 2019-09-04 DIAGNOSIS — M6281 Muscle weakness (generalized): Secondary | ICD-10-CM | POA: Diagnosis not present

## 2019-09-04 DIAGNOSIS — M545 Low back pain: Secondary | ICD-10-CM | POA: Diagnosis not present

## 2019-09-04 DIAGNOSIS — R262 Difficulty in walking, not elsewhere classified: Secondary | ICD-10-CM | POA: Diagnosis not present

## 2019-09-06 DIAGNOSIS — M545 Low back pain: Secondary | ICD-10-CM | POA: Diagnosis not present

## 2019-09-06 DIAGNOSIS — M6281 Muscle weakness (generalized): Secondary | ICD-10-CM | POA: Diagnosis not present

## 2019-09-06 DIAGNOSIS — R262 Difficulty in walking, not elsewhere classified: Secondary | ICD-10-CM | POA: Diagnosis not present

## 2019-09-11 DIAGNOSIS — R262 Difficulty in walking, not elsewhere classified: Secondary | ICD-10-CM | POA: Diagnosis not present

## 2019-09-11 DIAGNOSIS — M6281 Muscle weakness (generalized): Secondary | ICD-10-CM | POA: Diagnosis not present

## 2019-09-11 DIAGNOSIS — M545 Low back pain: Secondary | ICD-10-CM | POA: Diagnosis not present

## 2019-09-13 DIAGNOSIS — M6281 Muscle weakness (generalized): Secondary | ICD-10-CM | POA: Diagnosis not present

## 2019-09-13 DIAGNOSIS — M545 Low back pain: Secondary | ICD-10-CM | POA: Diagnosis not present

## 2019-09-13 DIAGNOSIS — R262 Difficulty in walking, not elsewhere classified: Secondary | ICD-10-CM | POA: Diagnosis not present

## 2019-09-18 DIAGNOSIS — M545 Low back pain: Secondary | ICD-10-CM | POA: Diagnosis not present

## 2019-09-18 DIAGNOSIS — M6281 Muscle weakness (generalized): Secondary | ICD-10-CM | POA: Diagnosis not present

## 2019-09-18 DIAGNOSIS — R262 Difficulty in walking, not elsewhere classified: Secondary | ICD-10-CM | POA: Diagnosis not present

## 2019-09-20 DIAGNOSIS — M6281 Muscle weakness (generalized): Secondary | ICD-10-CM | POA: Diagnosis not present

## 2019-09-20 DIAGNOSIS — M545 Low back pain: Secondary | ICD-10-CM | POA: Diagnosis not present

## 2019-09-20 DIAGNOSIS — R262 Difficulty in walking, not elsewhere classified: Secondary | ICD-10-CM | POA: Diagnosis not present

## 2019-09-25 DIAGNOSIS — M6281 Muscle weakness (generalized): Secondary | ICD-10-CM | POA: Diagnosis not present

## 2019-09-25 DIAGNOSIS — M545 Low back pain: Secondary | ICD-10-CM | POA: Diagnosis not present

## 2019-09-25 DIAGNOSIS — R262 Difficulty in walking, not elsewhere classified: Secondary | ICD-10-CM | POA: Diagnosis not present

## 2019-09-29 DIAGNOSIS — M6281 Muscle weakness (generalized): Secondary | ICD-10-CM | POA: Diagnosis not present

## 2019-09-29 DIAGNOSIS — R262 Difficulty in walking, not elsewhere classified: Secondary | ICD-10-CM | POA: Diagnosis not present

## 2019-10-02 DIAGNOSIS — R262 Difficulty in walking, not elsewhere classified: Secondary | ICD-10-CM | POA: Diagnosis not present

## 2019-10-02 DIAGNOSIS — M6281 Muscle weakness (generalized): Secondary | ICD-10-CM | POA: Diagnosis not present

## 2019-10-02 DIAGNOSIS — M545 Low back pain: Secondary | ICD-10-CM | POA: Diagnosis not present

## 2019-10-04 DIAGNOSIS — R262 Difficulty in walking, not elsewhere classified: Secondary | ICD-10-CM | POA: Diagnosis not present

## 2019-10-04 DIAGNOSIS — M6281 Muscle weakness (generalized): Secondary | ICD-10-CM | POA: Diagnosis not present

## 2019-10-04 DIAGNOSIS — M545 Low back pain: Secondary | ICD-10-CM | POA: Diagnosis not present

## 2019-10-09 DIAGNOSIS — M545 Low back pain: Secondary | ICD-10-CM | POA: Diagnosis not present

## 2019-10-09 DIAGNOSIS — R262 Difficulty in walking, not elsewhere classified: Secondary | ICD-10-CM | POA: Diagnosis not present

## 2019-10-09 DIAGNOSIS — M6281 Muscle weakness (generalized): Secondary | ICD-10-CM | POA: Diagnosis not present

## 2019-10-11 DIAGNOSIS — M545 Low back pain: Secondary | ICD-10-CM | POA: Diagnosis not present

## 2019-10-11 DIAGNOSIS — R262 Difficulty in walking, not elsewhere classified: Secondary | ICD-10-CM | POA: Diagnosis not present

## 2019-10-11 DIAGNOSIS — M6281 Muscle weakness (generalized): Secondary | ICD-10-CM | POA: Diagnosis not present

## 2019-10-16 DIAGNOSIS — M6281 Muscle weakness (generalized): Secondary | ICD-10-CM | POA: Diagnosis not present

## 2019-10-16 DIAGNOSIS — M545 Low back pain: Secondary | ICD-10-CM | POA: Diagnosis not present

## 2019-10-16 DIAGNOSIS — R262 Difficulty in walking, not elsewhere classified: Secondary | ICD-10-CM | POA: Diagnosis not present

## 2019-10-18 ENCOUNTER — Ambulatory Visit: Payer: Self-pay | Admitting: *Deleted

## 2019-10-18 DIAGNOSIS — M6281 Muscle weakness (generalized): Secondary | ICD-10-CM | POA: Diagnosis not present

## 2019-10-18 DIAGNOSIS — M545 Low back pain: Secondary | ICD-10-CM | POA: Diagnosis not present

## 2019-10-18 DIAGNOSIS — R262 Difficulty in walking, not elsewhere classified: Secondary | ICD-10-CM | POA: Diagnosis not present

## 2019-10-18 NOTE — Telephone Encounter (Signed)
Patient has appointment with PCP on 10/19/2019

## 2019-10-18 NOTE — Telephone Encounter (Signed)
Patient calling with complaints of numbness and tingling in right leg and for over a year. Pt states she has been undergoing physical therapy but still experiences back pain which she rates 8.5-9. Pt states she believes she has a slipped vertebrae from a back injury when she was younger. Pt transferred to [place] in office so that appt could be scheduled.  Reason for Disposition . [1] Numbness or tingling in one or both feet AND [2] is a chronic symptom (recurrent or ongoing AND present > 4 weeks) . [1] Pain radiates into the thigh or further down the leg AND [2] one leg  Answer Assessment - Initial Assessment Questions 1. SYMPTOM: "What is the main symptom you are concerned about?" (e.g., weakness, numbness)     Numbness and tingling in right leg and foot 2. ONSET: "When did this start?" (minutes, hours, days; while sleeping)     Over a year ago 3. LAST NORMAL: "When was the last time you were normal (no symptoms)?"     Over an year ago 4. PATTERN "Does this come and go, or has it been constant since it started?"  "Is it present now?"     Present now 5. CARDIAC SYMPTOMS: "Have you had any of the following symptoms: chest pain, difficulty breathing, palpitations?"     n/a 6. NEUROLOGIC SYMPTOMS: "Have you had any of the following symptoms: headache, dizziness, vision loss, double vision, changes in speech, unsteady on your feet?"     n/a 7. OTHER SYMPTOMS: "Do you have any other symptoms?"    Back pain 8. PREGNANCY: "Is there any chance you are pregnant?" "When was your last menstrual period?"     Not assessed  Protocols used: NEUROLOGIC DEFICIT-A-AH, BACK PAIN-A-AH

## 2019-10-19 ENCOUNTER — Encounter: Payer: Self-pay | Admitting: Family Medicine

## 2019-10-19 ENCOUNTER — Ambulatory Visit (INDEPENDENT_AMBULATORY_CARE_PROVIDER_SITE_OTHER): Payer: BC Managed Care – PPO | Admitting: Family Medicine

## 2019-10-19 ENCOUNTER — Other Ambulatory Visit: Payer: Self-pay

## 2019-10-19 DIAGNOSIS — M5431 Sciatica, right side: Secondary | ICD-10-CM | POA: Diagnosis not present

## 2019-10-19 NOTE — Progress Notes (Signed)
   Subjective:    Patient ID: Erika [name], female    DOB: 1981/03/02, 38 y.o.   MRN: [id number]  HPI Virtual Visit via Video Note  I connected with the patient on 10/19/19 at  9:45 AM EDT by a video enabled telemedicine application and verified that I am speaking with the correct person using two identifiers.  Location patient: home Location provider:work or home office Persons participating in the virtual visit: patient, provider  I discussed the limitations of evaluation and management by telemedicine and the availability of in person appointments. The patient expressed understanding and agreed to proceed.   HPI: Here to discuss her right sided low back pain and numbness and tingling in the right leg. This has been going on for over a year, and nothing she has tried has given her any relief. 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 Since then she has been seeing Dr. Charlann Boxer for PT and epidural steroid injections times 2, but nothing has helped. She has pain on walking and running is impossible due to the pain. She takes Ibuprofen as needed.    ROS: See pertinent positives and negatives per HPI.  Past Medical History:  Diagnosis Date  . Allergy   . Asthma     History reviewed. No pertinent surgical history.  History reviewed. No pertinent family history.   Current Outpatient Medications:  .  albuterol (VENTOLIN HFA) 108 (90 BASE) MCG/ACT inhaler, Inhale 2 puffs into the lungs every 4 (four) hours as needed for wheezing or shortness of breath., Disp: 1 Inhaler, Rfl: 5 .  Estradiol-Norethindrone Acet (MIMVEY LO) 0.5-0.1 MG tablet, Take 1 tablet by mouth daily., Disp: , Rfl:   EXAM:  VITALS per patient if applicable:  GENERAL: alert, oriented, appears well and in no acute distress  HEENT: atraumatic, conjunttiva clear, no obvious abnormalities on inspection of external nose and ears  NECK: normal  movements of the head and neck  LUNGS: on inspection no signs of respiratory distress, breathing rate appears normal, no obvious gross SOB, gasping or wheezing  CV: no obvious cyanosis  MS: moves all visible extremities without noticeable abnormality  PSYCH/NEURO: pleasant and cooperative, no obvious depression or anxiety, speech and thought processing grossly intact  ASSESSMENT AND PLAN: Right sided sciatica. We will refer her to Neurosurgery for further evaluation.  Alysia Penna, MD  Discussed the following assessment and plan:  No diagnosis found.     I discussed the assessment and treatment plan with the patient. The patient was provided an opportunity to ask questions and all were answered. The patient agreed with the plan and demonstrated an understanding of the instructions.   The patient was advised to call back or seek an in-person evaluation if the symptoms worsen or if the condition fails to improve as anticipated.     Review of Systems     Objective:   Physical Exam        Assessment & Plan:

## 2019-10-20 DIAGNOSIS — M4310 Spondylolisthesis, site unspecified: Secondary | ICD-10-CM | POA: Diagnosis not present

## 2019-10-27 ENCOUNTER — Other Ambulatory Visit: Payer: Self-pay | Admitting: Neurological Surgery

## 2019-10-27 DIAGNOSIS — M4310 Spondylolisthesis, site unspecified: Secondary | ICD-10-CM

## 2019-11-03 ENCOUNTER — Other Ambulatory Visit: Payer: Self-pay

## 2019-11-03 ENCOUNTER — Ambulatory Visit
Admission: RE | Admit: 2019-11-03 | Discharge: 2019-11-03 | Disposition: A | Discharge status: Home / Self Care | Payer: BC Managed Care – PPO | Source: Ambulatory Visit | Attending: Neurological Surgery | Admitting: Neurological Surgery

## 2019-11-03 DIAGNOSIS — M4310 Spondylolisthesis, site unspecified: Secondary | ICD-10-CM

## 2019-11-03 DIAGNOSIS — M545 Low back pain: Secondary | ICD-10-CM | POA: Diagnosis not present

## 2019-11-29 DIAGNOSIS — M5416 Radiculopathy, lumbar region: Secondary | ICD-10-CM | POA: Diagnosis not present

## 2019-12-21 DIAGNOSIS — R200 Anesthesia of skin: Secondary | ICD-10-CM | POA: Diagnosis not present

## 2019-12-21 DIAGNOSIS — M79605 Pain in left leg: Secondary | ICD-10-CM | POA: Diagnosis not present

## 2019-12-21 DIAGNOSIS — M5416 Radiculopathy, lumbar region: Secondary | ICD-10-CM | POA: Diagnosis not present

## 2019-12-21 DIAGNOSIS — M79604 Pain in right leg: Secondary | ICD-10-CM | POA: Diagnosis not present

## 2020-01-04 DIAGNOSIS — M79604 Pain in right leg: Secondary | ICD-10-CM | POA: Diagnosis not present

## 2020-01-15 DIAGNOSIS — M5416 Radiculopathy, lumbar region: Secondary | ICD-10-CM | POA: Diagnosis not present

## 2020-01-15 DIAGNOSIS — M7918 Myalgia, other site: Secondary | ICD-10-CM | POA: Diagnosis not present

## 2020-01-15 DIAGNOSIS — M461 Sacroiliitis, not elsewhere classified: Secondary | ICD-10-CM | POA: Diagnosis not present

## 2020-02-21 DIAGNOSIS — M461 Sacroiliitis, not elsewhere classified: Secondary | ICD-10-CM | POA: Diagnosis not present

## 2020-04-08 DIAGNOSIS — L814 Other melanin hyperpigmentation: Secondary | ICD-10-CM | POA: Diagnosis not present

## 2020-04-08 DIAGNOSIS — I781 Nevus, non-neoplastic: Secondary | ICD-10-CM | POA: Diagnosis not present

## 2020-04-08 DIAGNOSIS — L719 Rosacea, unspecified: Secondary | ICD-10-CM | POA: Diagnosis not present

## 2020-04-08 DIAGNOSIS — D1801 Hemangioma of skin and subcutaneous tissue: Secondary | ICD-10-CM | POA: Diagnosis not present

## 2020-04-29 DIAGNOSIS — Z0000 Encounter for general adult medical examination without abnormal findings: Secondary | ICD-10-CM | POA: Diagnosis not present

## 2020-04-29 DIAGNOSIS — E669 Obesity, unspecified: Secondary | ICD-10-CM | POA: Diagnosis not present

## 2020-04-29 DIAGNOSIS — Z01419 Encounter for gynecological examination (general) (routine) without abnormal findings: Secondary | ICD-10-CM | POA: Diagnosis not present

## 2020-04-29 DIAGNOSIS — Z1389 Encounter for screening for other disorder: Secondary | ICD-10-CM | POA: Diagnosis not present

## 2020-04-29 DIAGNOSIS — Z130 Encounter for screening for diseases of the blood and blood-forming organs and certain disorders involving the immune mechanism: Secondary | ICD-10-CM | POA: Diagnosis not present

## 2020-07-08 DIAGNOSIS — R7989 Other specified abnormal findings of blood chemistry: Secondary | ICD-10-CM | POA: Diagnosis not present

## 2020-12-31 DIAGNOSIS — M25561 Pain in right knee: Secondary | ICD-10-CM | POA: Diagnosis not present

## 2020-12-31 DIAGNOSIS — M25562 Pain in left knee: Secondary | ICD-10-CM | POA: Diagnosis not present

## 2021-01-04 DIAGNOSIS — M25561 Pain in right knee: Secondary | ICD-10-CM | POA: Diagnosis not present

## 2021-01-07 DIAGNOSIS — M25561 Pain in right knee: Secondary | ICD-10-CM | POA: Diagnosis not present

## 2021-01-07 DIAGNOSIS — M25562 Pain in left knee: Secondary | ICD-10-CM | POA: Diagnosis not present

## 2021-01-28 DIAGNOSIS — M25561 Pain in right knee: Secondary | ICD-10-CM | POA: Diagnosis not present

## 2021-04-16 DIAGNOSIS — G8918 Other acute postprocedural pain: Secondary | ICD-10-CM | POA: Diagnosis not present

## 2021-04-16 DIAGNOSIS — M1711 Unilateral primary osteoarthritis, right knee: Secondary | ICD-10-CM | POA: Diagnosis not present

## 2021-04-16 DIAGNOSIS — Y999 Unspecified external cause status: Secondary | ICD-10-CM | POA: Diagnosis not present

## 2021-04-16 DIAGNOSIS — X58XXXA Exposure to other specified factors, initial encounter: Secondary | ICD-10-CM | POA: Diagnosis not present

## 2021-04-16 DIAGNOSIS — M948X6 Other specified disorders of cartilage, lower leg: Secondary | ICD-10-CM | POA: Diagnosis not present

## 2021-04-16 DIAGNOSIS — S83281A Other tear of lateral meniscus, current injury, right knee, initial encounter: Secondary | ICD-10-CM | POA: Diagnosis not present

## 2021-04-16 DIAGNOSIS — S83271A Complex tear of lateral meniscus, current injury, right knee, initial encounter: Secondary | ICD-10-CM | POA: Diagnosis not present

## 2021-04-24 DIAGNOSIS — S83281D Other tear of lateral meniscus, current injury, right knee, subsequent encounter: Secondary | ICD-10-CM | POA: Diagnosis not present

## 2021-04-28 DIAGNOSIS — M25561 Pain in right knee: Secondary | ICD-10-CM | POA: Diagnosis not present

## 2021-04-28 DIAGNOSIS — M23361 Other meniscus derangements, other lateral meniscus, right knee: Secondary | ICD-10-CM | POA: Diagnosis not present

## 2021-04-30 DIAGNOSIS — M25561 Pain in right knee: Secondary | ICD-10-CM | POA: Diagnosis not present

## 2021-04-30 DIAGNOSIS — M23361 Other meniscus derangements, other lateral meniscus, right knee: Secondary | ICD-10-CM | POA: Diagnosis not present

## 2021-05-05 DIAGNOSIS — M23361 Other meniscus derangements, other lateral meniscus, right knee: Secondary | ICD-10-CM | POA: Diagnosis not present

## 2021-05-05 DIAGNOSIS — M25561 Pain in right knee: Secondary | ICD-10-CM | POA: Diagnosis not present

## 2021-05-07 DIAGNOSIS — M25561 Pain in right knee: Secondary | ICD-10-CM | POA: Diagnosis not present

## 2021-05-07 DIAGNOSIS — M23361 Other meniscus derangements, other lateral meniscus, right knee: Secondary | ICD-10-CM | POA: Diagnosis not present

## 2021-05-12 DIAGNOSIS — M23361 Other meniscus derangements, other lateral meniscus, right knee: Secondary | ICD-10-CM | POA: Diagnosis not present

## 2021-05-12 DIAGNOSIS — M25561 Pain in right knee: Secondary | ICD-10-CM | POA: Diagnosis not present

## 2021-05-12 DIAGNOSIS — S83281D Other tear of lateral meniscus, current injury, right knee, subsequent encounter: Secondary | ICD-10-CM | POA: Diagnosis not present

## 2021-05-15 DIAGNOSIS — M25561 Pain in right knee: Secondary | ICD-10-CM | POA: Diagnosis not present

## 2021-05-15 DIAGNOSIS — M23361 Other meniscus derangements, other lateral meniscus, right knee: Secondary | ICD-10-CM | POA: Diagnosis not present

## 2021-05-22 DIAGNOSIS — M23361 Other meniscus derangements, other lateral meniscus, right knee: Secondary | ICD-10-CM | POA: Diagnosis not present

## 2021-05-22 DIAGNOSIS — M25561 Pain in right knee: Secondary | ICD-10-CM | POA: Diagnosis not present

## 2021-05-29 DIAGNOSIS — M25561 Pain in right knee: Secondary | ICD-10-CM | POA: Diagnosis not present

## 2021-05-29 DIAGNOSIS — M23361 Other meniscus derangements, other lateral meniscus, right knee: Secondary | ICD-10-CM | POA: Diagnosis not present

## 2021-06-04 DIAGNOSIS — M23361 Other meniscus derangements, other lateral meniscus, right knee: Secondary | ICD-10-CM | POA: Diagnosis not present

## 2021-06-04 DIAGNOSIS — M25561 Pain in right knee: Secondary | ICD-10-CM | POA: Diagnosis not present

## 2021-06-05 DIAGNOSIS — M25561 Pain in right knee: Secondary | ICD-10-CM | POA: Diagnosis not present

## 2021-06-05 DIAGNOSIS — M23361 Other meniscus derangements, other lateral meniscus, right knee: Secondary | ICD-10-CM | POA: Diagnosis not present

## 2021-06-10 DIAGNOSIS — M23361 Other meniscus derangements, other lateral meniscus, right knee: Secondary | ICD-10-CM | POA: Diagnosis not present

## 2021-06-10 DIAGNOSIS — M25561 Pain in right knee: Secondary | ICD-10-CM | POA: Diagnosis not present

## 2021-06-12 DIAGNOSIS — M25561 Pain in right knee: Secondary | ICD-10-CM | POA: Diagnosis not present

## 2021-06-12 DIAGNOSIS — M23361 Other meniscus derangements, other lateral meniscus, right knee: Secondary | ICD-10-CM | POA: Diagnosis not present

## 2021-06-16 DIAGNOSIS — M25551 Pain in right hip: Secondary | ICD-10-CM | POA: Diagnosis not present

## 2021-06-16 DIAGNOSIS — M545 Low back pain, unspecified: Secondary | ICD-10-CM | POA: Diagnosis not present

## 2021-06-17 DIAGNOSIS — M23361 Other meniscus derangements, other lateral meniscus, right knee: Secondary | ICD-10-CM | POA: Diagnosis not present

## 2021-06-17 DIAGNOSIS — M25561 Pain in right knee: Secondary | ICD-10-CM | POA: Diagnosis not present

## 2021-06-18 DIAGNOSIS — M25561 Pain in right knee: Secondary | ICD-10-CM | POA: Diagnosis not present

## 2021-06-18 DIAGNOSIS — M23361 Other meniscus derangements, other lateral meniscus, right knee: Secondary | ICD-10-CM | POA: Diagnosis not present

## 2021-06-21 DIAGNOSIS — M545 Low back pain, unspecified: Secondary | ICD-10-CM | POA: Diagnosis not present

## 2021-06-25 DIAGNOSIS — M25561 Pain in right knee: Secondary | ICD-10-CM | POA: Diagnosis not present

## 2021-06-25 DIAGNOSIS — M23361 Other meniscus derangements, other lateral meniscus, right knee: Secondary | ICD-10-CM | POA: Diagnosis not present

## 2021-06-27 DIAGNOSIS — M25561 Pain in right knee: Secondary | ICD-10-CM | POA: Diagnosis not present

## 2021-06-27 DIAGNOSIS — M23361 Other meniscus derangements, other lateral meniscus, right knee: Secondary | ICD-10-CM | POA: Diagnosis not present

## 2021-06-30 ENCOUNTER — Encounter: Payer: Self-pay | Admitting: Family Medicine

## 2021-06-30 ENCOUNTER — Ambulatory Visit (INDEPENDENT_AMBULATORY_CARE_PROVIDER_SITE_OTHER): Payer: BC Managed Care – PPO | Admitting: Family Medicine

## 2021-06-30 ENCOUNTER — Other Ambulatory Visit: Payer: Self-pay

## 2021-06-30 VITALS — BP 118/86 | HR 62 | Temp 98.2°F | Wt 161.0 lb

## 2021-06-30 DIAGNOSIS — L237 Allergic contact dermatitis due to plants, except food: Secondary | ICD-10-CM

## 2021-06-30 MED ORDER — METHYLPREDNISOLONE 4 MG PO TBPK: ORAL_TABLET | ORAL | 0 refills | Status: DC

## 2021-06-30 NOTE — Progress Notes (Signed)
   Subjective:    Patient ID: Erika [name], female    DOB: Jun 25, 1981, 40 y.o.   MRN: [id number]  HPI Here for 3 days of an itch rash that started on her neck, and now it has spread to the left forearm. No pain. She has applied cortisone cream to the areas.    Review of Systems  Constitutional: Negative.   Respiratory: Negative.    Cardiovascular: Negative.   Skin:  Positive for rash.      Objective:   Physical Exam Constitutional:      Appearance: Normal appearance.  Cardiovascular:     Rate and Rhythm: Normal rate and regular rhythm.     Pulses: Normal pulses.     Heart sounds: Normal heart sounds.  Pulmonary:     Effort: Pulmonary effort is normal.     Breath sounds: Normal breath sounds.  Skin:    Comments: The left neck has a large area of red maculopapular rash. There is a smaller area on the left ventral forearm   Neurological:     Mental Status: She is alert.          Assessment & Plan:  Poison ivy, treat with a Medrol dose pack. Gershon Crane, MD

## 2021-07-03 DIAGNOSIS — M25561 Pain in right knee: Secondary | ICD-10-CM | POA: Diagnosis not present

## 2021-07-03 DIAGNOSIS — M23361 Other meniscus derangements, other lateral meniscus, right knee: Secondary | ICD-10-CM | POA: Diagnosis not present

## 2021-07-09 DIAGNOSIS — M25561 Pain in right knee: Secondary | ICD-10-CM | POA: Diagnosis not present

## 2021-07-09 DIAGNOSIS — M23361 Other meniscus derangements, other lateral meniscus, right knee: Secondary | ICD-10-CM | POA: Diagnosis not present

## 2021-07-11 DIAGNOSIS — M23361 Other meniscus derangements, other lateral meniscus, right knee: Secondary | ICD-10-CM | POA: Diagnosis not present

## 2021-07-11 DIAGNOSIS — M25561 Pain in right knee: Secondary | ICD-10-CM | POA: Diagnosis not present

## 2021-07-16 DIAGNOSIS — M25561 Pain in right knee: Secondary | ICD-10-CM | POA: Diagnosis not present

## 2021-07-16 DIAGNOSIS — M23361 Other meniscus derangements, other lateral meniscus, right knee: Secondary | ICD-10-CM | POA: Diagnosis not present

## 2021-07-17 DIAGNOSIS — M545 Low back pain, unspecified: Secondary | ICD-10-CM | POA: Diagnosis not present

## 2021-07-23 DIAGNOSIS — M25561 Pain in right knee: Secondary | ICD-10-CM | POA: Diagnosis not present

## 2021-07-23 DIAGNOSIS — M23361 Other meniscus derangements, other lateral meniscus, right knee: Secondary | ICD-10-CM | POA: Diagnosis not present

## 2021-07-25 DIAGNOSIS — M25561 Pain in right knee: Secondary | ICD-10-CM | POA: Diagnosis not present

## 2021-07-25 DIAGNOSIS — M23361 Other meniscus derangements, other lateral meniscus, right knee: Secondary | ICD-10-CM | POA: Diagnosis not present

## 2021-07-29 DIAGNOSIS — M23361 Other meniscus derangements, other lateral meniscus, right knee: Secondary | ICD-10-CM | POA: Diagnosis not present

## 2021-07-29 DIAGNOSIS — M25561 Pain in right knee: Secondary | ICD-10-CM | POA: Diagnosis not present

## 2021-07-30 DIAGNOSIS — M23361 Other meniscus derangements, other lateral meniscus, right knee: Secondary | ICD-10-CM | POA: Diagnosis not present

## 2021-07-30 DIAGNOSIS — M25561 Pain in right knee: Secondary | ICD-10-CM | POA: Diagnosis not present

## 2021-08-05 DIAGNOSIS — M25561 Pain in right knee: Secondary | ICD-10-CM | POA: Diagnosis not present

## 2021-08-05 DIAGNOSIS — M23361 Other meniscus derangements, other lateral meniscus, right knee: Secondary | ICD-10-CM | POA: Diagnosis not present

## 2021-08-07 DIAGNOSIS — M23361 Other meniscus derangements, other lateral meniscus, right knee: Secondary | ICD-10-CM | POA: Diagnosis not present

## 2021-08-07 DIAGNOSIS — M25561 Pain in right knee: Secondary | ICD-10-CM | POA: Diagnosis not present

## 2021-08-11 DIAGNOSIS — M25561 Pain in right knee: Secondary | ICD-10-CM | POA: Diagnosis not present

## 2021-08-11 DIAGNOSIS — M23361 Other meniscus derangements, other lateral meniscus, right knee: Secondary | ICD-10-CM | POA: Diagnosis not present

## 2021-08-14 DIAGNOSIS — M23361 Other meniscus derangements, other lateral meniscus, right knee: Secondary | ICD-10-CM | POA: Diagnosis not present

## 2021-08-14 DIAGNOSIS — M25561 Pain in right knee: Secondary | ICD-10-CM | POA: Diagnosis not present

## 2021-08-20 DIAGNOSIS — M25561 Pain in right knee: Secondary | ICD-10-CM | POA: Diagnosis not present

## 2021-08-20 DIAGNOSIS — M23361 Other meniscus derangements, other lateral meniscus, right knee: Secondary | ICD-10-CM | POA: Diagnosis not present

## 2021-08-22 DIAGNOSIS — R030 Elevated blood-pressure reading, without diagnosis of hypertension: Secondary | ICD-10-CM | POA: Diagnosis not present

## 2021-08-22 DIAGNOSIS — M461 Sacroiliitis, not elsewhere classified: Secondary | ICD-10-CM | POA: Diagnosis not present

## 2021-08-26 DIAGNOSIS — M23361 Other meniscus derangements, other lateral meniscus, right knee: Secondary | ICD-10-CM | POA: Diagnosis not present

## 2021-08-26 DIAGNOSIS — M25561 Pain in right knee: Secondary | ICD-10-CM | POA: Diagnosis not present

## 2021-08-27 DIAGNOSIS — Z124 Encounter for screening for malignant neoplasm of cervix: Secondary | ICD-10-CM | POA: Diagnosis not present

## 2021-08-27 DIAGNOSIS — E559 Vitamin D deficiency, unspecified: Secondary | ICD-10-CM | POA: Diagnosis not present

## 2021-08-27 DIAGNOSIS — Z6825 Body mass index (BMI) 25.0-25.9, adult: Secondary | ICD-10-CM | POA: Diagnosis not present

## 2021-08-27 DIAGNOSIS — Z01419 Encounter for gynecological examination (general) (routine) without abnormal findings: Secondary | ICD-10-CM | POA: Diagnosis not present

## 2021-08-27 DIAGNOSIS — Z1231 Encounter for screening mammogram for malignant neoplasm of breast: Secondary | ICD-10-CM | POA: Diagnosis not present

## 2021-08-27 DIAGNOSIS — Z1151 Encounter for screening for human papillomavirus (HPV): Secondary | ICD-10-CM | POA: Diagnosis not present

## 2021-08-27 DIAGNOSIS — Z13228 Encounter for screening for other metabolic disorders: Secondary | ICD-10-CM | POA: Diagnosis not present

## 2021-08-27 DIAGNOSIS — Z13220 Encounter for screening for lipoid disorders: Secondary | ICD-10-CM | POA: Diagnosis not present

## 2021-08-27 DIAGNOSIS — Z130 Encounter for screening for diseases of the blood and blood-forming organs and certain disorders involving the immune mechanism: Secondary | ICD-10-CM | POA: Diagnosis not present

## 2021-08-28 DIAGNOSIS — M23361 Other meniscus derangements, other lateral meniscus, right knee: Secondary | ICD-10-CM | POA: Diagnosis not present

## 2021-08-28 DIAGNOSIS — M25561 Pain in right knee: Secondary | ICD-10-CM | POA: Diagnosis not present

## 2021-09-02 DIAGNOSIS — M23361 Other meniscus derangements, other lateral meniscus, right knee: Secondary | ICD-10-CM | POA: Diagnosis not present

## 2021-09-02 DIAGNOSIS — M25561 Pain in right knee: Secondary | ICD-10-CM | POA: Diagnosis not present

## 2021-09-05 DIAGNOSIS — M25561 Pain in right knee: Secondary | ICD-10-CM | POA: Diagnosis not present

## 2021-09-05 DIAGNOSIS — M23361 Other meniscus derangements, other lateral meniscus, right knee: Secondary | ICD-10-CM | POA: Diagnosis not present

## 2021-09-12 ENCOUNTER — Other Ambulatory Visit: Payer: Self-pay | Admitting: Obstetrics and Gynecology

## 2021-09-12 DIAGNOSIS — R928 Other abnormal and inconclusive findings on diagnostic imaging of breast: Secondary | ICD-10-CM

## 2021-09-15 DIAGNOSIS — M23361 Other meniscus derangements, other lateral meniscus, right knee: Secondary | ICD-10-CM | POA: Diagnosis not present

## 2021-09-15 DIAGNOSIS — M25561 Pain in right knee: Secondary | ICD-10-CM | POA: Diagnosis not present

## 2021-09-17 DIAGNOSIS — M25561 Pain in right knee: Secondary | ICD-10-CM | POA: Diagnosis not present

## 2021-09-17 DIAGNOSIS — M23361 Other meniscus derangements, other lateral meniscus, right knee: Secondary | ICD-10-CM | POA: Diagnosis not present

## 2021-09-19 DIAGNOSIS — M5416 Radiculopathy, lumbar region: Secondary | ICD-10-CM | POA: Diagnosis not present

## 2021-09-30 DIAGNOSIS — M5416 Radiculopathy, lumbar region: Secondary | ICD-10-CM | POA: Diagnosis not present

## 2021-09-30 DIAGNOSIS — M545 Low back pain, unspecified: Secondary | ICD-10-CM | POA: Diagnosis not present

## 2021-10-02 ENCOUNTER — Other Ambulatory Visit: Payer: Self-pay | Admitting: Obstetrics and Gynecology

## 2021-10-02 ENCOUNTER — Ambulatory Visit
Admission: RE | Admit: 2021-10-02 | Discharge: 2021-10-02 | Disposition: A | Discharge status: Home / Self Care | Payer: BC Managed Care – PPO | Source: Ambulatory Visit | Attending: Obstetrics and Gynecology | Admitting: Obstetrics and Gynecology

## 2021-10-02 ENCOUNTER — Other Ambulatory Visit: Payer: Self-pay

## 2021-10-02 DIAGNOSIS — R922 Inconclusive mammogram: Secondary | ICD-10-CM | POA: Diagnosis not present

## 2021-10-02 DIAGNOSIS — R928 Other abnormal and inconclusive findings on diagnostic imaging of breast: Secondary | ICD-10-CM | POA: Diagnosis not present

## 2021-10-24 DIAGNOSIS — M5416 Radiculopathy, lumbar region: Secondary | ICD-10-CM | POA: Diagnosis not present

## 2021-11-07 DIAGNOSIS — M5451 Vertebrogenic low back pain: Secondary | ICD-10-CM | POA: Diagnosis not present

## 2021-11-10 DIAGNOSIS — R102 Pelvic and perineal pain: Secondary | ICD-10-CM | POA: Diagnosis not present

## 2021-11-18 ENCOUNTER — Other Ambulatory Visit: Payer: Self-pay | Admitting: Specialist

## 2021-11-18 DIAGNOSIS — M259 Joint disorder, unspecified: Secondary | ICD-10-CM | POA: Diagnosis not present

## 2021-11-18 DIAGNOSIS — M5451 Vertebrogenic low back pain: Secondary | ICD-10-CM | POA: Diagnosis not present

## 2021-11-26 DIAGNOSIS — M5416 Radiculopathy, lumbar region: Secondary | ICD-10-CM | POA: Diagnosis not present

## 2021-12-01 ENCOUNTER — Ambulatory Visit
Admission: RE | Admit: 2021-12-01 | Discharge: 2021-12-01 | Disposition: A | Discharge status: Home / Self Care | Payer: BC Managed Care – PPO | Source: Ambulatory Visit | Attending: Specialist | Admitting: Specialist

## 2021-12-01 DIAGNOSIS — M259 Joint disorder, unspecified: Secondary | ICD-10-CM

## 2021-12-01 DIAGNOSIS — M533 Sacrococcygeal disorders, not elsewhere classified: Secondary | ICD-10-CM | POA: Diagnosis not present

## 2021-12-09 DIAGNOSIS — M5451 Vertebrogenic low back pain: Secondary | ICD-10-CM | POA: Diagnosis not present

## 2021-12-23 ENCOUNTER — Encounter: Payer: Self-pay | Admitting: *Deleted

## 2021-12-24 DIAGNOSIS — M533 Sacrococcygeal disorders, not elsewhere classified: Secondary | ICD-10-CM | POA: Diagnosis not present

## 2021-12-26 ENCOUNTER — Ambulatory Visit: Payer: BC Managed Care – PPO | Admitting: Diagnostic Neuroimaging

## 2021-12-31 DIAGNOSIS — M5451 Vertebrogenic low back pain: Secondary | ICD-10-CM | POA: Diagnosis not present

## 2022-01-05 DIAGNOSIS — M5451 Vertebrogenic low back pain: Secondary | ICD-10-CM | POA: Diagnosis not present

## 2022-01-07 DIAGNOSIS — M5451 Vertebrogenic low back pain: Secondary | ICD-10-CM | POA: Diagnosis not present

## 2022-01-13 DIAGNOSIS — M5451 Vertebrogenic low back pain: Secondary | ICD-10-CM | POA: Diagnosis not present

## 2022-01-15 DIAGNOSIS — M5451 Vertebrogenic low back pain: Secondary | ICD-10-CM | POA: Diagnosis not present

## 2022-01-19 DIAGNOSIS — M5451 Vertebrogenic low back pain: Secondary | ICD-10-CM | POA: Diagnosis not present

## 2022-01-20 ENCOUNTER — Telehealth: Payer: Self-pay | Admitting: Family Medicine

## 2022-01-20 DIAGNOSIS — M533 Sacrococcygeal disorders, not elsewhere classified: Secondary | ICD-10-CM | POA: Diagnosis not present

## 2022-01-20 NOTE — Telephone Encounter (Signed)
Patient dropped off paperwork that needs to be filled out by Dr.Fry. Paper work was dropped in folder to be completed.    Please advise

## 2022-01-22 DIAGNOSIS — Z0279 Encounter for issue of other medical certificate: Secondary | ICD-10-CM

## 2022-01-27 NOTE — Telephone Encounter (Signed)
Pt surgical clearance paperwork from [facility] has been completed and faxed to Att Madlyn Frankel, copy sent to scanning

## 2022-01-27 NOTE — Telephone Encounter (Signed)
Pt was notified.  

## 2022-03-20 DIAGNOSIS — M533 Sacrococcygeal disorders, not elsewhere classified: Secondary | ICD-10-CM | POA: Diagnosis not present

## 2022-03-20 DIAGNOSIS — M9904 Segmental and somatic dysfunction of sacral region: Secondary | ICD-10-CM | POA: Diagnosis not present

## 2022-04-03 ENCOUNTER — Other Ambulatory Visit: Payer: BC Managed Care – PPO

## 2022-04-27 DIAGNOSIS — M533 Sacrococcygeal disorders, not elsewhere classified: Secondary | ICD-10-CM | POA: Diagnosis not present

## 2022-05-01 DIAGNOSIS — Z4889 Encounter for other specified surgical aftercare: Secondary | ICD-10-CM | POA: Diagnosis not present

## 2022-05-04 DIAGNOSIS — M533 Sacrococcygeal disorders, not elsewhere classified: Secondary | ICD-10-CM | POA: Diagnosis not present

## 2022-05-07 DIAGNOSIS — M533 Sacrococcygeal disorders, not elsewhere classified: Secondary | ICD-10-CM | POA: Diagnosis not present

## 2022-05-11 DIAGNOSIS — M533 Sacrococcygeal disorders, not elsewhere classified: Secondary | ICD-10-CM | POA: Diagnosis not present

## 2022-05-13 DIAGNOSIS — M533 Sacrococcygeal disorders, not elsewhere classified: Secondary | ICD-10-CM | POA: Diagnosis not present

## 2022-05-19 DIAGNOSIS — M533 Sacrococcygeal disorders, not elsewhere classified: Secondary | ICD-10-CM | POA: Diagnosis not present

## 2022-05-20 DIAGNOSIS — M533 Sacrococcygeal disorders, not elsewhere classified: Secondary | ICD-10-CM | POA: Diagnosis not present

## 2022-05-25 DIAGNOSIS — M533 Sacrococcygeal disorders, not elsewhere classified: Secondary | ICD-10-CM | POA: Diagnosis not present

## 2022-05-28 DIAGNOSIS — M533 Sacrococcygeal disorders, not elsewhere classified: Secondary | ICD-10-CM | POA: Diagnosis not present

## 2022-06-01 DIAGNOSIS — M533 Sacrococcygeal disorders, not elsewhere classified: Secondary | ICD-10-CM | POA: Diagnosis not present

## 2022-06-04 DIAGNOSIS — M533 Sacrococcygeal disorders, not elsewhere classified: Secondary | ICD-10-CM | POA: Diagnosis not present

## 2022-06-08 DIAGNOSIS — M533 Sacrococcygeal disorders, not elsewhere classified: Secondary | ICD-10-CM | POA: Diagnosis not present

## 2022-06-11 DIAGNOSIS — M533 Sacrococcygeal disorders, not elsewhere classified: Secondary | ICD-10-CM | POA: Diagnosis not present

## 2022-06-11 DIAGNOSIS — I781 Nevus, non-neoplastic: Secondary | ICD-10-CM | POA: Diagnosis not present

## 2022-06-11 DIAGNOSIS — L719 Rosacea, unspecified: Secondary | ICD-10-CM | POA: Diagnosis not present

## 2022-06-15 ENCOUNTER — Other Ambulatory Visit: Payer: Self-pay | Admitting: Obstetrics and Gynecology

## 2022-06-15 ENCOUNTER — Ambulatory Visit
Admission: RE | Admit: 2022-06-15 | Discharge: 2022-06-15 | Disposition: A | Discharge status: Home / Self Care | Payer: BC Managed Care – PPO | Source: Ambulatory Visit | Attending: Obstetrics and Gynecology | Admitting: Obstetrics and Gynecology

## 2022-06-15 DIAGNOSIS — R928 Other abnormal and inconclusive findings on diagnostic imaging of breast: Secondary | ICD-10-CM

## 2022-06-15 DIAGNOSIS — N6320 Unspecified lump in the left breast, unspecified quadrant: Secondary | ICD-10-CM

## 2022-06-15 DIAGNOSIS — N6324 Unspecified lump in the left breast, lower inner quadrant: Secondary | ICD-10-CM | POA: Diagnosis not present

## 2022-06-16 DIAGNOSIS — M533 Sacrococcygeal disorders, not elsewhere classified: Secondary | ICD-10-CM | POA: Diagnosis not present

## 2022-06-17 DIAGNOSIS — Z4889 Encounter for other specified surgical aftercare: Secondary | ICD-10-CM | POA: Diagnosis not present

## 2022-06-18 DIAGNOSIS — M533 Sacrococcygeal disorders, not elsewhere classified: Secondary | ICD-10-CM | POA: Diagnosis not present

## 2022-06-22 DIAGNOSIS — M533 Sacrococcygeal disorders, not elsewhere classified: Secondary | ICD-10-CM | POA: Diagnosis not present

## 2022-06-25 DIAGNOSIS — M533 Sacrococcygeal disorders, not elsewhere classified: Secondary | ICD-10-CM | POA: Diagnosis not present

## 2022-06-29 DIAGNOSIS — M533 Sacrococcygeal disorders, not elsewhere classified: Secondary | ICD-10-CM | POA: Diagnosis not present

## 2022-07-02 DIAGNOSIS — M533 Sacrococcygeal disorders, not elsewhere classified: Secondary | ICD-10-CM | POA: Diagnosis not present

## 2022-07-06 DIAGNOSIS — M533 Sacrococcygeal disorders, not elsewhere classified: Secondary | ICD-10-CM | POA: Diagnosis not present

## 2022-07-09 DIAGNOSIS — M533 Sacrococcygeal disorders, not elsewhere classified: Secondary | ICD-10-CM | POA: Diagnosis not present

## 2022-07-16 DIAGNOSIS — M533 Sacrococcygeal disorders, not elsewhere classified: Secondary | ICD-10-CM | POA: Diagnosis not present

## 2022-07-22 DIAGNOSIS — M533 Sacrococcygeal disorders, not elsewhere classified: Secondary | ICD-10-CM | POA: Diagnosis not present

## 2022-07-23 DIAGNOSIS — M533 Sacrococcygeal disorders, not elsewhere classified: Secondary | ICD-10-CM | POA: Diagnosis not present

## 2022-07-29 DIAGNOSIS — M533 Sacrococcygeal disorders, not elsewhere classified: Secondary | ICD-10-CM | POA: Diagnosis not present

## 2022-07-30 DIAGNOSIS — M533 Sacrococcygeal disorders, not elsewhere classified: Secondary | ICD-10-CM | POA: Diagnosis not present

## 2022-08-04 DIAGNOSIS — M533 Sacrococcygeal disorders, not elsewhere classified: Secondary | ICD-10-CM | POA: Diagnosis not present

## 2022-08-06 DIAGNOSIS — M533 Sacrococcygeal disorders, not elsewhere classified: Secondary | ICD-10-CM | POA: Diagnosis not present

## 2022-08-12 DIAGNOSIS — M533 Sacrococcygeal disorders, not elsewhere classified: Secondary | ICD-10-CM | POA: Diagnosis not present

## 2022-08-13 DIAGNOSIS — M533 Sacrococcygeal disorders, not elsewhere classified: Secondary | ICD-10-CM | POA: Diagnosis not present

## 2022-08-25 DIAGNOSIS — Z4889 Encounter for other specified surgical aftercare: Secondary | ICD-10-CM | POA: Diagnosis not present

## 2022-10-02 ENCOUNTER — Other Ambulatory Visit: Payer: Self-pay | Admitting: Obstetrics and Gynecology

## 2022-10-02 DIAGNOSIS — N6320 Unspecified lump in the left breast, unspecified quadrant: Secondary | ICD-10-CM

## 2022-10-07 DIAGNOSIS — Z1389 Encounter for screening for other disorder: Secondary | ICD-10-CM | POA: Diagnosis not present

## 2022-10-07 DIAGNOSIS — Z01419 Encounter for gynecological examination (general) (routine) without abnormal findings: Secondary | ICD-10-CM | POA: Diagnosis not present

## 2022-10-12 ENCOUNTER — Other Ambulatory Visit: Payer: BC Managed Care – PPO

## 2022-10-13 ENCOUNTER — Ambulatory Visit (INDEPENDENT_AMBULATORY_CARE_PROVIDER_SITE_OTHER): Payer: BC Managed Care – PPO | Admitting: Family Medicine

## 2022-10-13 ENCOUNTER — Encounter: Payer: Self-pay | Admitting: Family Medicine

## 2022-10-13 VITALS — BP 110/78 | HR 61 | Temp 98.7°F | Ht 67.0 in | Wt 179.0 lb

## 2022-10-13 DIAGNOSIS — J40 Bronchitis, not specified as acute or chronic: Secondary | ICD-10-CM

## 2022-10-13 DIAGNOSIS — J029 Acute pharyngitis, unspecified: Secondary | ICD-10-CM | POA: Diagnosis not present

## 2022-10-13 DIAGNOSIS — R059 Cough, unspecified: Secondary | ICD-10-CM

## 2022-10-13 LAB — POCT RAPID STREP A (OFFICE): Rapid Strep A Screen: NEGATIVE

## 2022-10-13 LAB — POCT INFLUENZA A/B
Influenza A, POC: NEGATIVE
Influenza B, POC: NEGATIVE

## 2022-10-13 LAB — POC COVID19 BINAXNOW: SARS Coronavirus 2 Ag: NEGATIVE

## 2022-10-13 MED ORDER — ALBUTEROL SULFATE HFA 108 (90 BASE) MCG/ACT IN AERS: 2.0000 | INHALATION_SPRAY | RESPIRATORY_TRACT | 5 refills | Status: AC | PRN

## 2022-10-13 MED ORDER — AZITHROMYCIN 250 MG PO TABS: ORAL_TABLET | ORAL | 0 refills | Status: DC

## 2022-10-13 NOTE — Progress Notes (Signed)
   Subjective:    Patient ID: Erika [name], female    DOB: 11-30-1981, 41 y.o.   MRN: [id number]  HPI Here for 4 days of stuffy head, PND, ST, and coughing up green sputum. No fever. Her daughter is here for the same thing.    Review of Systems  Constitutional: Negative.   HENT:  Positive for congestion, postnasal drip and sore throat. Negative for ear pain and sinus pressure.   Eyes: Negative.   Respiratory:  Positive for cough. Negative for shortness of breath and wheezing.        Objective:   Physical Exam Constitutional:      Appearance: Normal appearance.  HENT:     Right Ear: Tympanic membrane, ear canal and external ear normal.     Left Ear: Tympanic membrane, ear canal and external ear normal.     Nose: Nose normal.     Mouth/Throat:     Pharynx: Oropharynx is clear.  Eyes:     Conjunctiva/sclera: Conjunctivae normal.  Pulmonary:     Effort: Pulmonary effort is normal.     Breath sounds: Normal breath sounds.  Lymphadenopathy:     Cervical: No cervical adenopathy.  Neurological:     Mental Status: She is alert.           Assessment & Plan:  Bronchitis, treat with a Zpack and an albuterol inhaler.  Alysia Penna, MD

## 2022-10-19 ENCOUNTER — Ambulatory Visit
Admission: RE | Admit: 2022-10-19 | Discharge: 2022-10-19 | Disposition: A | Discharge status: Home / Self Care | Payer: BC Managed Care – PPO | Source: Ambulatory Visit | Attending: Obstetrics and Gynecology | Admitting: Obstetrics and Gynecology

## 2022-10-19 DIAGNOSIS — R92323 Mammographic fibroglandular density, bilateral breasts: Secondary | ICD-10-CM | POA: Diagnosis not present

## 2022-10-19 DIAGNOSIS — N6320 Unspecified lump in the left breast, unspecified quadrant: Secondary | ICD-10-CM

## 2022-10-19 DIAGNOSIS — N6324 Unspecified lump in the left breast, lower inner quadrant: Secondary | ICD-10-CM | POA: Diagnosis not present

## 2022-11-23 ENCOUNTER — Ambulatory Visit (INDEPENDENT_AMBULATORY_CARE_PROVIDER_SITE_OTHER): Payer: BC Managed Care – PPO | Admitting: Family Medicine

## 2022-11-23 VITALS — BP 132/90 | HR 65 | Temp 98.4°F | Ht 67.0 in | Wt 180.9 lb

## 2022-11-23 DIAGNOSIS — H6593 Unspecified nonsuppurative otitis media, bilateral: Secondary | ICD-10-CM | POA: Diagnosis not present

## 2022-11-23 MED ORDER — CETIRIZINE-PSEUDOEPHEDRINE ER 5-120 MG PO TB12: 1.0000 | ORAL_TABLET | Freq: Two times a day (BID) | ORAL | 0 refills | Status: AC

## 2022-11-23 MED ORDER — FLUTICASONE PROPIONATE 50 MCG/ACT NA SUSP: 1.0000 | Freq: Every day | NASAL | 0 refills | Status: DC

## 2022-11-23 NOTE — Progress Notes (Signed)
   Established Patient Office Visit  Subjective   Patient ID: Erika [name], female    DOB: September 04, 1981  Age: 41 y.o. MRN: [id number]  Chief Complaint  Patient presents with   Cough    Non-productive x6 days, tried Mucinex Sinus and Cold and Flu medication with no relief   Ear Pain    Bilateral ear pain x3 days   Fatigue    X4 days   Dizziness    X3 days    Patient reports flu like symptoms about a week ago, stated with increasing sinus congestion and she started mucinex OTC. Then a few days later she felt extremely tired, lots of fatigue, then having ear pain BL. There was some slight cough, some mucus but nothing significant. Mainly now her ears are very painful and she is feeling off balance. She had a fever initially but this resolved about on day 4 of her illness. She did not test for flu of COVID.       Review of Systems  HENT:  Positive for ear pain.   All other systems reviewed and are negative.     Objective:     BP (!) 132/90 (BP Location: Left Arm, Patient Position: Sitting, Cuff Size: Normal)   Pulse 65   Temp 98.4 F (36.9 C) (Oral)   Ht 5\' 7"  (1.702 m)   Wt 180 lb 14.4 oz (82.1 kg)   LMP 04/25/2013   SpO2 99%   BMI 28.33 kg/m    Physical Exam Constitutional:      Appearance: Normal appearance. She is normal weight.  HENT:     Right Ear: A middle ear effusion is present.     Left Ear: A middle ear effusion is present.  Cardiovascular:     Pulses: Normal pulses.     Heart sounds: Normal heart sounds.  Pulmonary:     Effort: Pulmonary effort is normal.     Breath sounds: Normal breath sounds. No wheezing.  Neurological:     Mental Status: She is alert.      No results found for any visits on 11/23/22.    The 10-year ASCVD risk score (Arnett DK, et al., 2019) is: 0.3%    Assessment & Plan:   Problem List Items Addressed This Visit   None Visit Diagnoses     Middle ear effusion, bilateral    -  Primary   Relevant Medications    Likely secondary to viral URI. Her other URI symptoms are improving, no fever and her TM's are not currently infected. I recommended treatment of the effusion with decongestants and flonase - 1 spray per nostril daily. I advised that she try to "pop" her ears several times per day to relieve the pressure. If not successful then may need to send to ENT.  cetirizine-pseudoephedrine (ZYRTEC-D) 5-120 MG tablet   fluticasone (FLONASE) 50 MCG/ACT nasal spray       No follow-ups on file.    2020, MD

## 2022-12-20 ENCOUNTER — Other Ambulatory Visit: Payer: Self-pay | Admitting: Family Medicine

## 2022-12-20 DIAGNOSIS — H6593 Unspecified nonsuppurative otitis media, bilateral: Secondary | ICD-10-CM

## 2023-01-06 ENCOUNTER — Other Ambulatory Visit: Payer: Self-pay | Admitting: Family Medicine

## 2023-01-06 DIAGNOSIS — H6593 Unspecified nonsuppurative otitis media, bilateral: Secondary | ICD-10-CM

## 2023-09-15 ENCOUNTER — Other Ambulatory Visit: Payer: Self-pay | Admitting: Obstetrics and Gynecology

## 2023-09-15 DIAGNOSIS — N63 Unspecified lump in unspecified breast: Secondary | ICD-10-CM

## 2023-10-13 ENCOUNTER — Other Ambulatory Visit: Payer: Self-pay | Admitting: Obstetrics and Gynecology

## 2023-10-13 DIAGNOSIS — E2839 Other primary ovarian failure: Secondary | ICD-10-CM

## 2023-10-21 ENCOUNTER — Ambulatory Visit
Admission: RE | Admit: 2023-10-21 | Discharge: 2023-10-21 | Disposition: A | Discharge status: Home / Self Care | Payer: BC Managed Care – PPO | Source: Ambulatory Visit | Attending: Obstetrics and Gynecology | Admitting: Obstetrics and Gynecology

## 2023-10-21 ENCOUNTER — Ambulatory Visit
Admission: RE | Admit: 2023-10-21 | Discharge: 2023-10-21 | Disposition: A | Discharge status: Home / Self Care | Payer: No Typology Code available for payment source | Source: Ambulatory Visit | Attending: Obstetrics and Gynecology | Admitting: Obstetrics and Gynecology

## 2023-10-21 DIAGNOSIS — N63 Unspecified lump in unspecified breast: Secondary | ICD-10-CM

## 2024-05-23 ENCOUNTER — Ambulatory Visit
Admission: RE | Admit: 2024-05-23 | Discharge: 2024-05-23 | Disposition: A | Discharge status: Home / Self Care | Payer: BC Managed Care – PPO | Source: Ambulatory Visit | Attending: Obstetrics and Gynecology | Admitting: Obstetrics and Gynecology

## 2024-05-23 DIAGNOSIS — E2839 Other primary ovarian failure: Secondary | ICD-10-CM
